# Patient Record
Sex: Male | Born: 1975 | Race: White | Hispanic: No | Marital: Single | Smoking: Current every day smoker
Health system: Southern US, Community
[De-identification: ages and names within clinical notes are randomized; demographics above are authoritative.]

## PROBLEM LIST (undated history)

## (undated) HISTORY — PX: SPLENECTOMY: SUR1306

---

## 2018-10-24 ENCOUNTER — Emergency Department (HOSPITAL_COMMUNITY): Payer: Self-pay

## 2018-10-24 ENCOUNTER — Encounter (HOSPITAL_COMMUNITY): Payer: Self-pay

## 2018-10-24 ENCOUNTER — Other Ambulatory Visit: Payer: Self-pay

## 2018-10-24 ENCOUNTER — Encounter (HOSPITAL_COMMUNITY): Payer: Self-pay | Admitting: Emergency Medicine

## 2018-10-24 ENCOUNTER — Emergency Department (HOSPITAL_COMMUNITY)
Admission: EM | Admit: 2018-10-24 | Discharge: 2018-10-24 | Disposition: A | Discharge status: Home / Self Care | Payer: Self-pay | Attending: Emergency Medicine | Admitting: Emergency Medicine

## 2018-10-24 ENCOUNTER — Emergency Department (HOSPITAL_COMMUNITY)
Admission: EM | Admit: 2018-10-24 | Discharge: 2018-10-25 | Discharge status: Against Medical Advice | Payer: Self-pay | Attending: Emergency Medicine | Admitting: Emergency Medicine

## 2018-10-24 DIAGNOSIS — L723 Sebaceous cyst: Secondary | ICD-10-CM | POA: Insufficient documentation

## 2018-10-24 DIAGNOSIS — Y999 Unspecified external cause status: Secondary | ICD-10-CM | POA: Insufficient documentation

## 2018-10-24 DIAGNOSIS — Y929 Unspecified place or not applicable: Secondary | ICD-10-CM | POA: Insufficient documentation

## 2018-10-24 DIAGNOSIS — L729 Follicular cyst of the skin and subcutaneous tissue, unspecified: Secondary | ICD-10-CM

## 2018-10-24 DIAGNOSIS — T07XXXA Unspecified multiple injuries, initial encounter: Secondary | ICD-10-CM | POA: Insufficient documentation

## 2018-10-24 DIAGNOSIS — L089 Local infection of the skin and subcutaneous tissue, unspecified: Secondary | ICD-10-CM

## 2018-10-24 DIAGNOSIS — Z041 Encounter for examination and observation following transport accident: Secondary | ICD-10-CM | POA: Insufficient documentation

## 2018-10-24 DIAGNOSIS — X58XXXA Exposure to other specified factors, initial encounter: Secondary | ICD-10-CM | POA: Insufficient documentation

## 2018-10-24 DIAGNOSIS — Y939 Activity, unspecified: Secondary | ICD-10-CM | POA: Insufficient documentation

## 2018-10-24 MED ORDER — LIDOCAINE HCL (PF) 1 % IJ SOLN
10.0000 mL | Freq: Once | INTRAMUSCULAR | Status: AC
  Administered 2018-10-24: 10 mL
  Filled 2018-10-24: qty 30

## 2018-10-24 MED ORDER — DOXYCYCLINE HYCLATE 100 MG PO CAPS: 100.0000 mg | ORAL_CAPSULE | Freq: Two times a day (BID) | ORAL | 0 refills | Status: DC

## 2018-10-24 NOTE — ED Provider Notes (Signed)
[facility]-EMERGENCY DEPT Provider Note   CSN: [id number] Arrival date & time: 10/24/18  1557     History   Chief Complaint Chief Complaint  Patient presents with  . Abscess    HPI Aaron Berger is a 42 y.o. male presenting for evaluation of facial lesion.   Patient states for the past 2 years, he has had a bump on his right chin.  Occasionally, he pops it and it drains fluid.  It has never been painful until today.  Today the pain is increased, and he has not been able to drain anything from it.  He denies fevers, chills, nausea, vomiting.  He has no medical problems, takes no medications daily.  He has not taken anything for it including Tylenol or ibuprofen.  When it drains, he states it drains pus then blood.  He has not seen anybody about this in the past.  Today, pain is been constant, worse with palpation.  Last tetanus shot was last year  HPI  History reviewed. No pertinent past medical history.  There are no active problems to display for this patient.   History reviewed. No pertinent surgical history.      Home Medications    Prior to Admission medications   Medication Sig Start Date End Date Taking? Authorizing Provider  doxycycline (VIBRAMYCIN) 100 MG capsule Take 1 capsule (100 mg total) by mouth 2 (two) times daily. 10/24/18   Dariona Postma, PA-C    Family History History reviewed. No pertinent family history.  Social History Social History   Tobacco Use  . Smoking status: Current Every Day Smoker  . Smokeless tobacco: Never Used  Substance Use Topics  . Alcohol use: Yes  . Drug use: Never     Allergies   Patient has no known allergies.   Review of Systems Review of Systems  Constitutional: Negative for fever.  HENT:       Lesion of r chin     Physical Exam Updated Vital Signs BP (!) 146/96 (BP Location: Left Arm)   Pulse 79   Temp 97.7 F (36.5 C) (Oral)   Resp 20   Ht 5\' 9"  (1.753 m)   Wt 77.1 kg    SpO2 100%   BMI 25.10 kg/m   Physical Exam  Constitutional: He is oriented to person, place, and time. He appears well-developed and well-nourished. No distress.  Sitting comfortably in the bed in no acute distress  HENT:  Head: Normocephalic and atraumatic.    Small lesion on the right chin.  No active drainage.  No overlying erythema.  Fluctuant.  No streaking.  No swelling under the chin or elsewhere around the face  Eyes: Pupils are equal, round, and reactive to light. Conjunctivae and EOM are normal.  Neck: Normal range of motion. Neck supple.  Cardiovascular: Normal rate, regular rhythm and intact distal pulses.  Pulmonary/Chest: Effort normal and breath sounds normal. No respiratory distress. He has no wheezes.  Abdominal: Soft. He exhibits no distension. There is no tenderness.  Musculoskeletal: Normal range of motion.  Neurological: He is alert and oriented to person, place, and time.  Skin: Skin is warm and dry. Capillary refill takes less than 2 seconds.  Psychiatric: He has a normal mood and affect.  Nursing note and vitals reviewed.    ED Treatments / Results  Labs (all labs ordered are listed, but only abnormal results are displayed) Labs Reviewed - No data to display  EKG None  Radiology No results  found.  Procedures .Marland KitchenIncision and Drainage Date/Time: 10/24/2018 6:08 PM Performed by: Alveria Apley, PA-C Authorized by: Alveria Apley, PA-C   Consent:    Consent obtained:  Verbal   Consent given by:  Patient   Risks discussed:  Bleeding, incomplete drainage, pain, infection and damage to other organs Location:    Indications for incision and drainage: Infected cyst.   Location:  Head   Head location:  Face Pre-procedure details:    Skin preparation:  Chloraprep Anesthesia (see MAR for exact dosages):    Anesthesia method:  Local infiltration   Local anesthetic:  Lidocaine 1% w/o epi Procedure type:    Complexity:  Simple Procedure details:      Needle aspiration: no     Incision types:  Single straight   Incision depth:  Dermal   Scalpel blade:  11   Wound management:  Probed and deloculated and irrigated with saline   Drainage:  Purulent, serosanguinous and bloody   Drainage amount:  Moderate   Wound treatment:  Wound left open   Packing materials:  None Post-procedure details:    Patient tolerance of procedure:  Tolerated well, no immediate complications   (including critical care time)  Medications Ordered in ED Medications  lidocaine (PF) (XYLOCAINE) 1 % injection 10 mL (10 mLs Infiltration Given by Other 10/24/18 1705)     Initial Impression / Assessment and Plan / ED Course  I have reviewed the triage vital signs and the nursing notes.  Pertinent labs & imaging results that were available during my care of the patient were reviewed by me and considered in my medical decision making (see chart for details).     Patient presenting for evaluation of lesion on the face which is been present for 2 years.  Today, lesion is more painful.  Physical exam reassuring, no signs of systemic infection.  He appears nontoxic.  No fevers, chills, tachycardia.  On exam, small, soft, and fluctuant lesion of the right chin.  Discussed risks including scarring, and likelihood that this is a cyst.  Patient requesting I&D, understands risks of scarring and infection.  I&D performed as described above.  Serosanguineous and purulent material expressed.  Discussed wound care.  Discussed follow-up with plastic surgery for removal of the cyst sac and/or ENT.  At this time, patient received a discharge.  Return precautions given.  Patient states he understands and agrees to plan.   Final Clinical Impressions(s) / ED Diagnoses   Final diagnoses:  Infected cyst of skin    ED Discharge Orders         Ordered    doxycycline (VIBRAMYCIN) 100 MG capsule  2 times daily     10/24/18 1656           Alveria Apley, PA-C 10/24/18  1810    Long, Arlyss Repress, MD 10/25/18 1549

## 2018-10-24 NOTE — ED Triage Notes (Signed)
Reports abscess to right side of his face x 2 years.  Reports he does drain this at home on occasion but the pain is increasing.

## 2018-10-24 NOTE — Discharge Instructions (Signed)
Take antibiotics as prescribed.  Take the entire course, even if your symptoms improve. Wash daily with soap and water, and reapply new dressing until it is no longer draining. Follow-up with Dr. Ulice Boldillingham if the cyst returns or becomes uncomfortable.  If her practice does not take out facials cysts, you may follow-up with Dr. Ezzard StandingNewman. Return to the emergency room with any new, worsening, concerning symptoms.
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
## 2018-10-25 ENCOUNTER — Encounter (HOSPITAL_COMMUNITY): Payer: Self-pay | Admitting: Emergency Medicine

## 2018-10-25 LAB — CBC WITH DIFFERENTIAL/PLATELET
Band Neutrophils: 0 %
Basophils Absolute: 0.2 10*3/uL — ABNORMAL HIGH (ref 0.0–0.1)
Basophils Relative: 1 %
Blasts: 0 %
EOS PCT: 3 %
Eosinophils Absolute: 0.5 10*3/uL (ref 0.0–0.5)
HCT: 45.3 % (ref 39.0–52.0)
Hemoglobin: 14.2 g/dL (ref 13.0–17.0)
LYMPHS ABS: 10.2 10*3/uL — AB (ref 0.7–4.0)
Lymphocytes Relative: 56 %
MCH: 30 pg (ref 26.0–34.0)
MCHC: 31.3 g/dL (ref 30.0–36.0)
MCV: 95.6 fL (ref 80.0–100.0)
Metamyelocytes Relative: 0 %
Monocytes Absolute: 1.5 10*3/uL — ABNORMAL HIGH (ref 0.1–1.0)
Monocytes Relative: 8 %
Myelocytes: 0 %
Neutro Abs: 5.8 10*3/uL (ref 1.7–7.7)
Neutrophils Relative %: 32 %
Other: 0 %
Platelets: 511 10*3/uL — ABNORMAL HIGH (ref 150–400)
Promyelocytes Relative: 0 %
RBC: 4.74 MIL/uL (ref 4.22–5.81)
RDW: 13.5 % (ref 11.5–15.5)
WBC: 18.2 10*3/uL — ABNORMAL HIGH (ref 4.0–10.5)
nRBC: 0 % (ref 0.0–0.2)
nRBC: 0 /100 WBC

## 2018-10-25 LAB — COMPREHENSIVE METABOLIC PANEL
ALT: 27 U/L (ref 0–44)
AST: 26 U/L (ref 15–41)
Albumin: 4.1 g/dL (ref 3.5–5.0)
Alkaline Phosphatase: 99 U/L (ref 38–126)
Anion gap: 15 (ref 5–15)
BUN: 9 mg/dL (ref 6–20)
CO2: 20 mmol/L — AB (ref 22–32)
Calcium: 8.9 mg/dL (ref 8.9–10.3)
Chloride: 99 mmol/L (ref 98–111)
Creatinine, Ser: 0.75 mg/dL (ref 0.61–1.24)
GFR calc Af Amer: >60 mL/min (ref >60)
GFR calc non Af Amer: >60 mL/min (ref >60)
Glucose, Bld: 98 mg/dL (ref 70–99)
Potassium: 3.7 mmol/L (ref 3.5–5.1)
Sodium: 134 mmol/L — ABNORMAL LOW (ref 135–145)
Total Bilirubin: 0.3 mg/dL (ref 0.3–1.2)
Total Protein: 7.4 g/dL (ref 6.5–8.1)

## 2018-10-25 LAB — PATHOLOGIST SMEAR REVIEW
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
## 2019-03-30 ENCOUNTER — Emergency Department (HOSPITAL_BASED_OUTPATIENT_CLINIC_OR_DEPARTMENT_OTHER)
Admission: EM | Admit: 2019-03-30 | Discharge: 2019-03-30 | Disposition: A | Discharge status: Home / Self Care | Payer: Self-pay | Attending: Emergency Medicine | Admitting: Emergency Medicine

## 2019-03-30 ENCOUNTER — Encounter (HOSPITAL_BASED_OUTPATIENT_CLINIC_OR_DEPARTMENT_OTHER): Payer: Self-pay | Admitting: *Deleted

## 2019-03-30 ENCOUNTER — Other Ambulatory Visit: Payer: Self-pay

## 2019-03-30 DIAGNOSIS — W57XXXA Bitten or stung by nonvenomous insect and other nonvenomous arthropods, initial encounter: Secondary | ICD-10-CM | POA: Insufficient documentation

## 2019-03-30 DIAGNOSIS — Y999 Unspecified external cause status: Secondary | ICD-10-CM | POA: Insufficient documentation

## 2019-03-30 DIAGNOSIS — F17210 Nicotine dependence, cigarettes, uncomplicated: Secondary | ICD-10-CM | POA: Insufficient documentation

## 2019-03-30 DIAGNOSIS — Y9389 Activity, other specified: Secondary | ICD-10-CM | POA: Insufficient documentation

## 2019-03-30 DIAGNOSIS — S00261A Insect bite (nonvenomous) of right eyelid and periocular area, initial encounter: Secondary | ICD-10-CM | POA: Insufficient documentation

## 2019-03-30 DIAGNOSIS — Y929 Unspecified place or not applicable: Secondary | ICD-10-CM | POA: Insufficient documentation

## 2019-03-30 MED ORDER — PREDNISONE 20 MG PO TABS: ORAL_TABLET | ORAL | 0 refills | Status: DC

## 2019-03-30 NOTE — ED Provider Notes (Signed)
[facility] EMERGENCY DEPARTMENT Provider Note   CSN: [id number] Arrival date & time: 03/30/19  0820    History   Chief Complaint Chief Complaint  Patient presents with  . Insect Bite    HPI Aaron Berger is a 43 y.o. male.     Patient is a 43 year old male who presents with some swelling over his right eye.  He states that he thinks he got bitten by something.  It happened yesterday evening.  It got worse this morning when he woke up.  He states he noticed a little bump where he thinks that something bit him while he was sleeping.  It is intensely itchy.  He has some swelling to his upper eyelid.  He denies any vision changes.  He denies any pain.  No drainage.  No fevers.  He has not use any medications for the symptoms.     History reviewed. No pertinent past medical history.  There are no active problems to display for this patient.   Past Surgical History:  Procedure Laterality Date  . SPLENECTOMY          Home Medications    Prior to Admission medications   Medication Sig Start Date End Date Taking? Authorizing Provider  doxycycline (VIBRAMYCIN) 100 MG capsule Take 1 capsule (100 mg total) by mouth 2 (two) times daily. 10/24/18   Caccavale, Sophia, PA-C  predniSONE (DELTASONE) 20 MG tablet 3 tabs po day one, then 2 po daily x 4 days 03/30/19   Rolan BuccoBelfi, Deryck Hippler, MD    Family History Family History  Problem Relation Age of Onset  . Cirrhosis Mother     Social History Social History   Tobacco Use  . Smoking status: Current Every Day Smoker    Packs/day: 1.00    Types: Cigarettes  . Smokeless tobacco: Never Used  Substance Use Topics  . Alcohol use: Yes    Comment: everyday - 1-2 cans of beer  . Drug use: Never     Allergies   Patient has no known allergies.   Review of Systems Review of Systems  Constitutional: Negative for fever.  HENT: Negative for congestion.   Eyes: Negative for photophobia, pain, discharge, redness, itching  and visual disturbance.  Gastrointestinal: Negative for vomiting.  Musculoskeletal: Negative for arthralgias.  Skin: Positive for rash.  Neurological: Negative for dizziness and headaches.     Physical Exam Updated Vital Signs BP (!) 132/98 (BP Location: Right Arm)   Pulse 81   Temp 97.9 F (36.6 C) (Oral)   Resp 18   Ht 5\' 10"  (1.778 m)   Wt 79.4 kg   SpO2 99%   BMI 25.11 kg/m   Physical Exam Constitutional:      Appearance: Normal appearance.  HENT:     Head: Normocephalic.     Comments: Patient has a small raised area over his right eyebrow.  There is some localized swelling to the area and swelling to the right upper eyelid.  It is nontender.  There is no induration.  No fluctuance.  No drainage. Eyes:     Extraocular Movements: Extraocular movements intact.     Conjunctiva/sclera: Conjunctivae normal.     Pupils: Pupils are equal, round, and reactive to light.     Comments: No conjunctival injection.  No photophobia  Cardiovascular:     Rate and Rhythm: Normal rate.  Pulmonary:     Effort: Pulmonary effort is normal.  Musculoskeletal: Normal range of motion.  Skin:    General:  Skin is warm and dry.  Neurological:     Mental Status: He is alert.        ED Treatments / Results  Labs (all labs ordered are listed, but only abnormal results are displayed) Labs Reviewed - No data to display  EKG None  Radiology No results found.  Procedures Procedures (including critical care time)  Medications Ordered in ED Medications - No data to display   Initial Impression / Assessment and Plan / ED Course  I have reviewed the triage vital signs and the nursing notes.  Pertinent labs & imaging results that were available during my care of the patient were reviewed by me and considered in my medical decision making (see chart for details).        Patient presents with what appears to be an insect bite over his right eye.  There is no evidence of abscess.   It is intensely itchy and not painful.  It looks most consistent with an insect bite with localized reaction versus infection.  There is no warmth.  There is mild erythema.  I will treat him with a 5-day course of prednisone.  He was also advised to use Benadryl as needed for the itching.  Return precautions were given.  Final Clinical Impressions(s) / ED Diagnoses   Final diagnoses:  Insect bite of right periocular area, initial encounter    ED Discharge Orders         Ordered    predniSONE (DELTASONE) 20 MG tablet     03/30/19 0845           Rolan Bucco, MD 03/30/19 [phone]

## 2019-03-30 NOTE — ED Notes (Signed)
ED Provider at bedside. 

## 2019-03-30 NOTE — ED Triage Notes (Signed)
Woke up yesterday morning around 2 am and noted that his right eye is swollen and he went back to sleep.  This morning, he noticed that it is more swollen today than yesterday and it itches.

## 2019-03-30 NOTE — ED Notes (Signed)
Pt verbalized understanding of dc instructions.

## 2019-11-25 ENCOUNTER — Other Ambulatory Visit: Payer: Self-pay

## 2019-11-25 ENCOUNTER — Encounter (HOSPITAL_BASED_OUTPATIENT_CLINIC_OR_DEPARTMENT_OTHER): Payer: Self-pay

## 2019-11-25 ENCOUNTER — Emergency Department (HOSPITAL_BASED_OUTPATIENT_CLINIC_OR_DEPARTMENT_OTHER)
Admission: EM | Admit: 2019-11-25 | Discharge: 2019-11-25 | Disposition: A | Discharge status: Home / Self Care | Payer: Self-pay | Attending: Emergency Medicine | Admitting: Emergency Medicine

## 2019-11-25 DIAGNOSIS — M25552 Pain in left hip: Secondary | ICD-10-CM | POA: Insufficient documentation

## 2019-11-25 DIAGNOSIS — F17210 Nicotine dependence, cigarettes, uncomplicated: Secondary | ICD-10-CM | POA: Insufficient documentation

## 2019-11-25 MED ORDER — CYCLOBENZAPRINE HCL 10 MG PO TABS: 10.0000 mg | ORAL_TABLET | Freq: Two times a day (BID) | ORAL | 0 refills | Status: DC | PRN

## 2019-11-25 MED ORDER — PREDNISONE 20 MG PO TABS: 40.0000 mg | ORAL_TABLET | Freq: Every day | ORAL | 0 refills | Status: DC

## 2019-11-25 NOTE — Discharge Instructions (Addendum)
Take Prednisone for the next 5 days  Take Flexeril as needed for muscle pain or spasms. Do not drink alcohol or drive while taking this medicine Use ice for 20 minutes at a time over the hip area Follow up with Orthopedics if symptoms are not improving

## 2019-11-25 NOTE — ED Provider Notes (Signed)
[facility] EMERGENCY DEPARTMENT Provider Note   CSN: [id number] Arrival date & time: 11/25/19  1603     History Chief Complaint  Patient presents with  . Hip Pain    Aaron Berger is a 44 y.o. male who presents with left hip pain.  Patient states that the pain started on Thursday while he was at work.  He works in [employer].  He denies a specific injury.  Over the next several days the pain worsened he states that Saturday is the worst and he could get out of bed.  His girlfriend gave him a leftover Flexeril he states this improved his pain.  Over the next couple of days he has had waxing and waning pain.  It is primarily over the lateral left hip.  It is worse when he touches the area and lying on the left side.  He is also had pain radiating from his hip to his foot at times.  He states he is not having any pain right now.  He denies low back pain or knee pain. He denies history of similar pain.  HPI     History reviewed. No pertinent past medical history.  There are no problems to display for this patient.   Past Surgical History:  Procedure Laterality Date  . SPLENECTOMY         Family History  Problem Relation Age of Onset  . Cirrhosis Mother     Social History   Tobacco Use  . Smoking status: Current Every Day Smoker    Packs/day: 1.00    Types: Cigarettes  . Smokeless tobacco: Never Used  Substance Use Topics  . Alcohol use: Yes    Comment: daily  . Drug use: Never    Home Medications Prior to Admission medications   Medication Sig Start Date End Date Taking? Authorizing Provider  doxycycline (VIBRAMYCIN) 100 MG capsule Take 1 capsule (100 mg total) by mouth 2 (two) times daily. 10/24/18   Caccavale, Sophia, PA-C  predniSONE (DELTASONE) 20 MG tablet 3 tabs po day one, then 2 po daily x 4 days 03/30/19   Malvin Johns, MD    Allergies    Patient has no known allergies.  Review of Systems   Review of Systems  Musculoskeletal: Positive  for arthralgias.  Neurological: Negative for weakness and numbness.    Physical Exam Updated Vital Signs BP (!) 153/102 (BP Location: Right Arm)   Pulse 73   Temp 98.7 F (37.1 C) (Oral)   Resp 14   Ht 6' (1.829 m)   Wt 79.4 kg   SpO2 100%   BMI 23.73 kg/m   Physical Exam Vitals and nursing note reviewed.  Constitutional:      General: He is not in acute distress.    Appearance: Normal appearance. He is well-developed. He is not ill-appearing.  HENT:     Head: Normocephalic and atraumatic.  Eyes:     General: No scleral icterus.       Right eye: No discharge.        Left eye: No discharge.     Conjunctiva/sclera: Conjunctivae normal.     Pupils: Pupils are equal, round, and reactive to light.  Cardiovascular:     Rate and Rhythm: Normal rate.  Pulmonary:     Effort: Pulmonary effort is normal. No respiratory distress.  Abdominal:     General: There is no distension.  Musculoskeletal:     Cervical back: Normal range of motion.  Comments: Tenderness over the left buttocks and left lateral hip. No back, groin, thigh, knee tenderness.   Ambulatory with limp  Skin:    General: Skin is warm and dry.  Neurological:     Mental Status: He is alert and oriented to person, place, and time.  Psychiatric:        Behavior: Behavior normal.     ED Results / Procedures / Treatments   Labs (all labs ordered are listed, but only abnormal results are displayed) Labs Reviewed - No data to display  EKG None  Radiology No results found.  Procedures Procedures (including critical care time)  Medications Ordered in ED Medications - No data to display  ED Course  I have reviewed the triage vital signs and the nursing notes.  Pertinent labs & imaging results that were available during my care of the patient were reviewed by me and considered in my medical decision making (see chart for details).  44 year old male presents with left hip pain for the past 5 days.  He is  hypertensive but otherwise vitals are normal.  He is tender over the left buttock and left lateral hip.  Suspect trochanteric bursitis versus sciatica due to tight muscles such as piriformis syndrome.  Do not feel imaging is necessary today.  We will treat with steroid burst and muscle relaxers.  Advised follow-up with orthopedics if symptoms are not improving.  Work note given.   MDM Rules/Calculators/A&P                       Final Clinical Impression(s) / ED Diagnoses Final diagnoses:  Left hip pain    Rx / DC Orders ED Discharge Orders    None       Bethel Born, PA-C 11/25/19 1933    Alvira Monday, MD 11/27/19 1429

## 2019-11-25 NOTE — ED Triage Notes (Signed)
Pt c/o pain to left hip area that radiates down left LE-pain started 11/19/2018-denies injury-states he needs a work note-NAD-limping gait

## 2019-12-03 ENCOUNTER — Encounter (HOSPITAL_COMMUNITY): Payer: Self-pay

## 2019-12-03 ENCOUNTER — Emergency Department (HOSPITAL_COMMUNITY)
Admission: EM | Admit: 2019-12-03 | Discharge: 2019-12-03 | Disposition: A | Discharge status: Home / Self Care | Payer: Self-pay | Attending: Emergency Medicine | Admitting: Emergency Medicine

## 2019-12-03 ENCOUNTER — Emergency Department (HOSPITAL_COMMUNITY): Payer: Self-pay

## 2019-12-03 DIAGNOSIS — Y999 Unspecified external cause status: Secondary | ICD-10-CM | POA: Insufficient documentation

## 2019-12-03 DIAGNOSIS — Y92410 Unspecified street and highway as the place of occurrence of the external cause: Secondary | ICD-10-CM | POA: Insufficient documentation

## 2019-12-03 DIAGNOSIS — Y93I9 Activity, other involving external motion: Secondary | ICD-10-CM | POA: Insufficient documentation

## 2019-12-03 DIAGNOSIS — F17210 Nicotine dependence, cigarettes, uncomplicated: Secondary | ICD-10-CM | POA: Insufficient documentation

## 2019-12-03 DIAGNOSIS — S51011A Laceration without foreign body of right elbow, initial encounter: Secondary | ICD-10-CM | POA: Insufficient documentation

## 2019-12-03 MED ORDER — CEPHALEXIN 500 MG PO CAPS
500.0000 mg | ORAL_CAPSULE | Freq: Once | ORAL | Status: AC
  Administered 2019-12-03: 500 mg via ORAL
  Filled 2019-12-03: qty 1

## 2019-12-03 MED ORDER — CEPHALEXIN 500 MG PO CAPS: 500.0000 mg | ORAL_CAPSULE | Freq: Two times a day (BID) | ORAL | 0 refills | Status: AC
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
## 2019-12-03 NOTE — ED Notes (Signed)
Pt has an inch laceration to his right elbow, it appears to be a deep puncture wound, bleeding is not controlled
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
## 2019-12-22 ENCOUNTER — Emergency Department (HOSPITAL_BASED_OUTPATIENT_CLINIC_OR_DEPARTMENT_OTHER): Payer: Self-pay

## 2019-12-22 ENCOUNTER — Encounter (HOSPITAL_BASED_OUTPATIENT_CLINIC_OR_DEPARTMENT_OTHER): Payer: Self-pay | Admitting: *Deleted

## 2019-12-22 ENCOUNTER — Emergency Department (HOSPITAL_BASED_OUTPATIENT_CLINIC_OR_DEPARTMENT_OTHER)
Admission: EM | Admit: 2019-12-22 | Discharge: 2019-12-22 | Disposition: A | Discharge status: Home / Self Care | Payer: Self-pay | Attending: Emergency Medicine | Admitting: Emergency Medicine

## 2019-12-22 ENCOUNTER — Other Ambulatory Visit: Payer: Self-pay

## 2019-12-22 DIAGNOSIS — F17210 Nicotine dependence, cigarettes, uncomplicated: Secondary | ICD-10-CM | POA: Insufficient documentation

## 2019-12-22 DIAGNOSIS — Y929 Unspecified place or not applicable: Secondary | ICD-10-CM | POA: Insufficient documentation

## 2019-12-22 DIAGNOSIS — Y939 Activity, unspecified: Secondary | ICD-10-CM | POA: Insufficient documentation

## 2019-12-22 DIAGNOSIS — S9031XA Contusion of right foot, initial encounter: Secondary | ICD-10-CM | POA: Insufficient documentation

## 2019-12-22 DIAGNOSIS — W208XXA Other cause of strike by thrown, projected or falling object, initial encounter: Secondary | ICD-10-CM | POA: Insufficient documentation

## 2019-12-22 DIAGNOSIS — Y999 Unspecified external cause status: Secondary | ICD-10-CM | POA: Insufficient documentation

## 2019-12-22 NOTE — Discharge Instructions (Signed)
Please read instructions below. Apply ice to your foot for 20 minutes at a time. You can take 600mg  of ibuprofen every 6 hours as needed for pain. Being weight-bearing as tolerated. Schedule an appointment with your primary care if symptoms persist. Return to the ER for new or concerning symptoms.

## 2019-12-22 NOTE — ED Notes (Signed)
1500- UDS obtained as per employers instructions.  Ace wrap applied and crutches set up with instructions of use per Jonny Ruiz EMT

## 2019-12-22 NOTE — ED Notes (Signed)
ED Provider at bedside. 

## 2019-12-22 NOTE — ED Provider Notes (Signed)
[facility] EMERGENCY DEPARTMENT Provider Note   CSN: [id number] Arrival date & time: 12/22/19  1308     History Chief Complaint  Patient presents with  . Foot Injury    Aaron Berger is a 44 y.o. male presenting with sudden onset of right foot pain that began prior to arrival.  Patient states a 6 x 6 wooden pole fell onto the top of his foot.  He is significant pain to the dorsum of his foot that is worse with movement.  His ankle is not bothering him.  He has pain with range of motion and weightbearing.  No interventions tried prior to arrival.  The history is provided by the patient.       History reviewed. No pertinent past medical history.  There are no problems to display for this patient.   Past Surgical History:  Procedure Laterality Date  . SPLENECTOMY         Family History  Problem Relation Age of Onset  . Cirrhosis Mother     Social History   Tobacco Use  . Smoking status: Current Every Day Smoker    Packs/day: 1.00    Types: Cigarettes  . Smokeless tobacco: Never Used  Substance Use Topics  . Alcohol use: Yes    Comment: daily  . Drug use: Never    Home Medications Prior to Admission medications   Medication Sig Start Date End Date Taking? Authorizing Provider  cyclobenzaprine (FLEXERIL) 10 MG tablet Take 1 tablet (10 mg total) by mouth 2 (two) times daily as needed for muscle spasms. 11/25/19   Bethel Born, PA-C  predniSONE (DELTASONE) 20 MG tablet Take 2 tablets (40 mg total) by mouth daily. 11/25/19   Bethel Born, PA-C    Allergies    Patient has no known allergies.  Review of Systems   Review of Systems  All other systems reviewed and are negative.   Physical Exam Updated Vital Signs BP (!) 131/96 (BP Location: Right Arm)   Pulse 70   Temp 98.1 F (36.7 C)   Resp 15   Ht 6' (1.829 m)   Wt 79.4 kg   SpO2 98%   BMI 23.73 kg/m   Physical Exam Vitals and nursing note reviewed.  Constitutional:    Appearance: He is well-developed.  HENT:     Head: Normocephalic and atraumatic.  Eyes:     Conjunctiva/sclera: Conjunctivae normal.  Cardiovascular:     Rate and Rhythm: Normal rate.  Pulmonary:     Effort: Pulmonary effort is normal.  Musculoskeletal:     Comments: Right dorsal foot with some light bruising present over the first and second metatarsals.  There is associated tenderness here and slight swelling.  No deformity.  No wounds.  Normal range of motion of the ankle without pain.  Neurological:     Mental Status: He is alert.  Psychiatric:        Mood and Affect: Mood normal.        Behavior: Behavior normal.     ED Results / Procedures / Treatments   Labs (all labs ordered are listed, but only abnormal results are displayed) Labs Reviewed - No data to display  EKG None  Radiology DG Foot Complete Right  Result Date: 12/22/2019 CLINICAL DATA:  Injury.  Right foot pain EXAM: RIGHT FOOT COMPLETE - 3+ VIEW COMPARISON:  None. FINDINGS: There is no evidence of fracture or dislocation. There is no evidence of arthropathy or other focal bone abnormality.  Soft tissues are unremarkable. IMPRESSION: Negative. Electronically Signed   By: Franchot Gallo M.D.   On: 12/22/2019 13:48    Procedures Procedures (including critical care time)  Medications Ordered in ED Medications - No data to display  ED Course  I have reviewed the triage vital signs and the nursing notes.  Pertinent labs & imaging results that were available during my care of the patient were reviewed by me and considered in my medical decision making (see chart for details).    MDM Rules/Calculators/A&P                      Patient with right foot contusion after a 6 x 6 post of wood fell onto the top of his foot today.  No wounds.  X-rays negative for acute fracture.  Exam consistent with contusion.  Will treat symptomatically with RICE therapy including Ace wrap.  OTC medications for pain. Will provide  crutches to use for the next couple of days as he is having a bit of pain with weightbearing.  recommend he begin weightbearing as tolerated.  PCP follow-up if symptoms persist.  Patient is agreeable to plan and safe for discharge.  Discussed results, findings, treatment and follow up. Patient advised of return precautions. Patient verbalized understanding and agreed with plan.  Final Clinical Impression(s) / ED Diagnoses Final diagnoses:  Contusion of right foot, initial encounter    Rx / DC Orders ED Discharge Orders    None       , [place] N, PA-C 12/22/19 1412    Sherwood Gambler, MD 12/24/19 [phone]
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
## 2020-04-25 ENCOUNTER — Emergency Department (HOSPITAL_BASED_OUTPATIENT_CLINIC_OR_DEPARTMENT_OTHER)
Admission: EM | Admit: 2020-04-25 | Discharge: 2020-04-25 | Disposition: A | Discharge status: Home / Self Care | Payer: Self-pay | Attending: Emergency Medicine | Admitting: Emergency Medicine

## 2020-04-25 ENCOUNTER — Encounter (HOSPITAL_BASED_OUTPATIENT_CLINIC_OR_DEPARTMENT_OTHER): Payer: Self-pay | Admitting: *Deleted

## 2020-04-25 ENCOUNTER — Other Ambulatory Visit: Payer: Self-pay

## 2020-04-25 DIAGNOSIS — F17210 Nicotine dependence, cigarettes, uncomplicated: Secondary | ICD-10-CM | POA: Insufficient documentation

## 2020-04-25 DIAGNOSIS — X500XXA Overexertion from strenuous movement or load, initial encounter: Secondary | ICD-10-CM | POA: Insufficient documentation

## 2020-04-25 DIAGNOSIS — Z79899 Other long term (current) drug therapy: Secondary | ICD-10-CM | POA: Insufficient documentation

## 2020-04-25 DIAGNOSIS — M25511 Pain in right shoulder: Secondary | ICD-10-CM | POA: Insufficient documentation

## 2020-04-25 NOTE — ED Triage Notes (Signed)
Rt shoulder pain, injury to rt shoulder this past Thursday, working on a garage door and spring kicked back

## 2020-04-25 NOTE — ED Provider Notes (Signed)
[facility] EMERGENCY DEPARTMENT Provider Note   CSN: [id number] Arrival date & time: 04/25/20  1035     History Chief Complaint  Patient presents with  . Rt Shoulder Pain    Aaron Berger is a 44 y.o. male presenting with right shoulder pain that began Friday. 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 He was pulling back a spring when he heard a pop in his right shoulder. He had pain on the top of his shoulder that was worse with any movement, however today he feels as though his symptoms have nearly resolved. Movement still causes some pain though he describes that feeling as like stretching a sore muscle, and feels somewhat good to do. He presents for a note to return to work.   The history is provided by the patient.       History reviewed. No pertinent past medical history.  There are no problems to display for this patient.   Past Surgical History:  Procedure Laterality Date  . SPLENECTOMY         Family History  Problem Relation Age of Onset  . Cirrhosis Mother     Social History   Tobacco Use  . Smoking status: Current Every Day Smoker    Packs/day: 1.00    Types: Cigarettes  . Smokeless tobacco: Never Used  Substance Use Topics  . Alcohol use: Yes    Comment: daily  . Drug use: Never    Home Medications Prior to Admission medications   Medication Sig Start Date End Date Taking? Authorizing Provider  cyclobenzaprine (FLEXERIL) 10 MG tablet Take 1 tablet (10 mg total) by mouth 2 (two) times daily as needed for muscle spasms. 11/25/19   Aaron Born, PA-C  predniSONE (DELTASONE) 20 MG tablet Take 2 tablets (40 mg total) by mouth daily. 11/25/19   Aaron Born, PA-C    Allergies    Patient has no known allergies.  Review of Systems   Review of Systems  Musculoskeletal: Positive for arthralgias.  Neurological: Negative for numbness.    Physical Exam Updated Vital Signs BP (!) 125/97 (BP Location: Right  Arm)   Pulse 100   Temp 98.4 F (36.9 C) (Oral)   Resp 20   Ht 5\' 10"  (1.778 m)   Wt 77.1 kg   SpO2 98%   BMI 24.39 kg/m   Physical Exam Vitals and nursing note reviewed.  Constitutional:      General: He is not in acute distress.    Appearance: He is well-developed.  HENT:     Head: Normocephalic and atraumatic.  Eyes:     Conjunctiva/sclera: Conjunctivae normal.  Cardiovascular:     Rate and Rhythm: Normal rate.  Pulmonary:     Effort: Pulmonary effort is normal.  Musculoskeletal:     Comments: Right shoulder without deformity or swelling. Mild TTP over the Aaron Berger joint/distal end of the acromion. Some pain with empty can test. Normal ROM. 5/5 grip strength BUE. Normal sensation and radial pulses.  Neurological:     Mental Status: He is alert.  Psychiatric:        Mood and Affect: Mood normal.        Behavior: Behavior normal.     ED Results / Procedures / Treatments   Labs (all labs ordered are listed, but only abnormal results are displayed) Labs Reviewed - No data to display  EKG None  Radiology No results found.  Procedures Procedures (including critical  care time)  Medications Ordered in ED Medications - No data to display  ED Course  I have reviewed the triage vital signs and the nursing notes.  Pertinent labs & imaging results that were available during my care of the patient were reviewed by me and considered in my medical decision making (see chart for details).    MDM Rules/Calculators/A&P                      Pt presenting with right shoulder pain that began on Friday while at work.  He states pain is nearly resolved, he is here for a note to return to work as he injured it while at work.  Patient has some pain with empty can test though otherwise has reassuring exam.  Return to normal activity as tolerated.  Recommend if symptoms return began having pain again, to follow-up outpatient.  Referral to sports medicine provided.  Symptomatic management  discussed.  Safe for discharge.  Final Clinical Impression(s) / ED Diagnoses Final diagnoses:  Acute pain of right shoulder    Rx / DC Orders ED Discharge Orders    None       Aaron Berger, Aaron N, PA-C 04/25/20 1106    Aaron Belling, MD 04/25/20 1505

## 2020-04-25 NOTE — Discharge Instructions (Signed)
Please read instructions below. Apply ice to your shoulder for 20 minutes at a time as needed. You can take ibuprofen every 6 hours as needed for pain. Schedule an appointment with the sports medicine physician for follow-up on your injury if symptoms persist. Return to the ER for new or concerning symptoms.

## 2020-11-05 ENCOUNTER — Inpatient Hospital Stay (HOSPITAL_BASED_OUTPATIENT_CLINIC_OR_DEPARTMENT_OTHER)
Admission: EM | Admit: 2020-11-05 | Discharge: 2020-11-07 | DRG: 482 | Disposition: A | Discharge status: Home / Self Care | Payer: Worker's Compensation | Attending: Family Medicine | Admitting: Family Medicine

## 2020-11-05 ENCOUNTER — Emergency Department (HOSPITAL_BASED_OUTPATIENT_CLINIC_OR_DEPARTMENT_OTHER): Payer: Worker's Compensation

## 2020-11-05 ENCOUNTER — Encounter (HOSPITAL_BASED_OUTPATIENT_CLINIC_OR_DEPARTMENT_OTHER): Payer: Self-pay

## 2020-11-05 ENCOUNTER — Other Ambulatory Visit: Payer: Self-pay

## 2020-11-05 DIAGNOSIS — Z20822 Contact with and (suspected) exposure to covid-19: Secondary | ICD-10-CM | POA: Diagnosis present

## 2020-11-05 DIAGNOSIS — S72002A Fracture of unspecified part of neck of left femur, initial encounter for closed fracture: Secondary | ICD-10-CM | POA: Diagnosis present

## 2020-11-05 DIAGNOSIS — Z419 Encounter for procedure for purposes other than remedying health state, unspecified: Secondary | ICD-10-CM

## 2020-11-05 DIAGNOSIS — W1789XA Other fall from one level to another, initial encounter: Secondary | ICD-10-CM | POA: Diagnosis present

## 2020-11-05 DIAGNOSIS — Z7952 Long term (current) use of systemic steroids: Secondary | ICD-10-CM | POA: Diagnosis not present

## 2020-11-05 DIAGNOSIS — S7292XA Unspecified fracture of left femur, initial encounter for closed fracture: Secondary | ICD-10-CM

## 2020-11-05 DIAGNOSIS — E559 Vitamin D deficiency, unspecified: Secondary | ICD-10-CM | POA: Diagnosis present

## 2020-11-05 DIAGNOSIS — Z9081 Acquired absence of spleen: Secondary | ICD-10-CM

## 2020-11-05 DIAGNOSIS — F17210 Nicotine dependence, cigarettes, uncomplicated: Secondary | ICD-10-CM | POA: Diagnosis present

## 2020-11-05 DIAGNOSIS — D72829 Elevated white blood cell count, unspecified: Secondary | ICD-10-CM | POA: Diagnosis present

## 2020-11-05 DIAGNOSIS — S72142A Displaced intertrochanteric fracture of left femur, initial encounter for closed fracture: Principal | ICD-10-CM | POA: Diagnosis present

## 2020-11-05 LAB — CBC WITH DIFFERENTIAL/PLATELET
Abs Immature Granulocytes: 0.11 10*3/uL — ABNORMAL HIGH (ref 0.00–0.07)
Basophils Absolute: 0.1 10*3/uL (ref 0.0–0.1)
Basophils Relative: 0 %
Eosinophils Absolute: 0.1 10*3/uL (ref 0.0–0.5)
Eosinophils Relative: 0 %
HCT: 44.8 % (ref 39.0–52.0)
Hemoglobin: 15.4 g/dL (ref 13.0–17.0)
Immature Granulocytes: 1 %
Lymphocytes Relative: 15 %
Lymphs Abs: 3.5 10*3/uL (ref 0.7–4.0)
MCH: 32.6 pg (ref 26.0–34.0)
MCHC: 34.4 g/dL (ref 30.0–36.0)
MCV: 94.9 fL (ref 80.0–100.0)
Monocytes Absolute: 2 10*3/uL — ABNORMAL HIGH (ref 0.1–1.0)
Monocytes Relative: 9 %
Neutro Abs: 18.3 10*3/uL — ABNORMAL HIGH (ref 1.7–7.7)
Neutrophils Relative %: 75 %
Platelets: 511 10*3/uL — ABNORMAL HIGH (ref 150–400)
RBC: 4.72 MIL/uL (ref 4.22–5.81)
RDW: 13.2 % (ref 11.5–15.5)
WBC: 24.1 10*3/uL — ABNORMAL HIGH (ref 4.0–10.5)
nRBC: 0 % (ref 0.0–0.2)

## 2020-11-05 LAB — RESP PANEL BY RT-PCR (FLU A&B, COVID) ARPGX2
Influenza A by PCR: NEGATIVE
Influenza B by PCR: NEGATIVE
SARS Coronavirus 2 by RT PCR: NEGATIVE

## 2020-11-05 LAB — BASIC METABOLIC PANEL
Anion gap: 13 (ref 5–15)
BUN: 10 mg/dL (ref 6–20)
CO2: 23 mmol/L (ref 22–32)
Calcium: 9.4 mg/dL (ref 8.9–10.3)
Chloride: 101 mmol/L (ref 98–111)
Creatinine, Ser: 0.74 mg/dL (ref 0.61–1.24)
GFR, Estimated: >60 mL/min (ref >60)
Glucose, Bld: 103 mg/dL — ABNORMAL HIGH (ref 70–99)
Potassium: 3.8 mmol/L (ref 3.5–5.1)
Sodium: 137 mmol/L (ref 135–145)

## 2020-11-05 LAB — LACTIC ACID, PLASMA: Lactic Acid, Venous: 1.1 mmol/L (ref 0.5–1.9)

## 2020-11-05 LAB — URINALYSIS, ROUTINE W REFLEX MICROSCOPIC
Bilirubin Urine: NEGATIVE
Glucose, UA: NEGATIVE mg/dL
Hgb urine dipstick: NEGATIVE
Ketones, ur: NEGATIVE mg/dL
Leukocytes,Ua: NEGATIVE
Nitrite: NEGATIVE
Protein, ur: NEGATIVE mg/dL
Specific Gravity, Urine: 1.02 (ref 1.005–1.030)
pH: 6 (ref 5.0–8.0)

## 2020-11-05 LAB — ETHANOL: Alcohol, Ethyl (B): <10 mg/dL (ref <10)

## 2020-11-05 MED ORDER — IOHEXOL 300 MG/ML  SOLN
100.0000 mL | Freq: Once | INTRAMUSCULAR | Status: AC | PRN
  Administered 2020-11-05: 100 mL via INTRAVENOUS

## 2020-11-05 MED ORDER — HYDROMORPHONE HCL 1 MG/ML IJ SOLN: 0.5000 mg | INTRAMUSCULAR | Status: DC | PRN

## 2020-11-05 MED ORDER — HYDROMORPHONE HCL 1 MG/ML IJ SOLN
1.0000 mg | Freq: Once | INTRAMUSCULAR | Status: AC
  Administered 2020-11-05: 1 mg via INTRAVENOUS
  Filled 2020-11-05: qty 1

## 2020-11-05 MED ORDER — HYDROMORPHONE HCL 1 MG/ML IJ SOLN
1.0000 mg | Freq: Once | INTRAMUSCULAR | Status: AC
Start: 2020-11-05 — End: 2020-11-05
  Administered 2020-11-05: 1 mg via INTRAVENOUS
  Filled 2020-11-05: qty 1

## 2020-11-05 MED ORDER — HYDROMORPHONE HCL 1 MG/ML IJ SOLN
1.0000 mg | INTRAMUSCULAR | Status: DC | PRN
Start: 2020-11-05 — End: 2020-11-07
  Administered 2020-11-05 – 2020-11-06 (×4): 1 mg via INTRAVENOUS
  Filled 2020-11-05 (×5): qty 1

## 2020-11-05 MED ORDER — FENTANYL CITRATE (PF) 100 MCG/2ML IJ SOLN
INTRAMUSCULAR | Status: AC
  Filled 2020-11-05: qty 2

## 2020-11-05 MED ORDER — LACTATED RINGERS IV SOLN: INTRAVENOUS | Status: AC

## 2020-11-05 MED ORDER — FENTANYL CITRATE (PF) 100 MCG/2ML IJ SOLN
50.0000 ug | INTRAMUSCULAR | Status: DC | PRN
Start: 2020-11-05 — End: 2020-11-05

## 2020-11-05 MED ORDER — FENTANYL CITRATE (PF) 100 MCG/2ML IJ SOLN
50.0000 ug | Freq: Once | INTRAMUSCULAR | Status: AC
  Administered 2020-11-05: 50 ug via INTRAVENOUS

## 2020-11-05 NOTE — ED Notes (Signed)
Verbal order from ED provider Dr. Carlean Jews for Dilaudid 1mg .

## 2020-11-05 NOTE — ED Triage Notes (Signed)
Pt states was tying a rope on his work truck and fell off. Reports pain to L hip.

## 2020-11-05 NOTE — ED Notes (Signed)
Assumed care of pt from hallway bed.

## 2020-11-05 NOTE — ED Provider Notes (Addendum)
[facility] EMERGENCY DEPARTMENT Provider Note   CSN: [id number] Arrival date & time: 11/05/20  1559     History Chief Complaint  Patient presents with  . Fall    Aaron Berger is a 44 y.o. male otherwise healthy no daily medication use.  Patient arrives with left hip pain after fall that occurred just prior to arrival.  He was at work standing about 4 feet off the ground on a truck and pulling the door closed with a rope when he slipped backwards falling directly onto his left hip.  Immediate onset severe pain throbbing aching constant nonradiating worsened with movement and palpation minimally improved with rest, no medications prior to arrival.  Denies head injury, blood thinner use, loss of consciousness, headache, neck pain, back pain, chest pain, abdominal pain, numbness/tingling, weakness, pain of the upper extremities, pain of the right lower extremity or any additional concerns.  HPI     History reviewed. No pertinent past medical history.  Patient Active Problem List   Diagnosis Date Noted  . Leukocytosis 11/06/2020  . Nicotine dependence, cigarettes, uncomplicated 11/06/2020  . Closed left hip fracture, initial encounter (HCC) 11/05/2020    Past Surgical History:  Procedure Laterality Date  . SPLENECTOMY         Family History  Problem Relation Age of Onset  . Cirrhosis Mother     Social History   Tobacco Use  . Smoking status: Current Every Day Smoker    Packs/day: 1.00    Types: Cigarettes  . Smokeless tobacco: Never Used  Vaping Use  . Vaping Use: Never used  Substance Use Topics  . Alcohol use: Yes    Comment: daily  . Drug use: Never    Home Medications Prior to Admission medications   Medication Sig Start Date End Date Taking? Authorizing Provider  cyclobenzaprine (FLEXERIL) 10 MG tablet Take 1 tablet (10 mg total) by mouth 2 (two) times daily as needed for muscle spasms. Patient not taking: No sig reported 11/25/19   Bethel Born, PA-C  predniSONE (DELTASONE) 20 MG tablet Take 2 tablets (40 mg total) by mouth daily. Patient not taking: No sig reported 11/25/19   Bethel Born, PA-C    Allergies    Patient has no known allergies.  Review of Systems   Review of Systems Ten systems are reviewed and are negative for acute change except as noted in the HPI  Physical Exam Updated Vital Signs BP 129/89 (BP Location: Right Arm)   Pulse 72   Temp 98.5 F (36.9 C) (Oral)   Resp 14   Ht 6' (1.829 m)   Wt 79.4 kg   SpO2 96%   BMI 23.73 kg/m   Physical Exam Constitutional:      General: He is not in acute distress.    Appearance: Normal appearance. He is well-developed. He is not ill-appearing or diaphoretic.  HENT:     Head: Normocephalic and atraumatic. No abrasion or contusion.     Jaw: There is normal jaw occlusion. No trismus.     Right Ear: External ear normal.     Left Ear: External ear normal.     Nose: Nose normal.     Mouth/Throat:     Mouth: Mucous membranes are moist.     Pharynx: Oropharynx is clear.  Eyes:     General: Vision grossly intact. Gaze aligned appropriately.     Extraocular Movements: Extraocular movements intact.     Conjunctiva/sclera: Conjunctivae normal.  Pupils: Pupils are equal, round, and reactive to light.  Neck:     Trachea: Trachea and phonation normal. No tracheal tenderness or tracheal deviation.  Cardiovascular:     Rate and Rhythm: Normal rate and regular rhythm.     Pulses:          Dorsalis pedis pulses are 2+ on the right side and 2+ on the left side.  Pulmonary:     Effort: Pulmonary effort is normal. No respiratory distress.     Breath sounds: Normal breath sounds and air entry.  Chest:     Chest wall: No deformity, tenderness or crepitus.  Abdominal:     General: There is no distension.     Palpations: Abdomen is soft.     Tenderness: There is no abdominal tenderness. There is no guarding or rebound.  Musculoskeletal:        General:  Normal range of motion.     Cervical back: Normal range of motion and neck supple.       Legs:     Comments: No midline C/T/L spinal tenderness to palpation, no paraspinal muscle tenderness, no deformity, crepitus, or step-off noted.  Bony pelvis stable to compression without pain.  Tenderness at the left hip with small area of erythema present but no skin break.  All other major joints of the bilateral upper and lower extremities mobilized and palpated without pain   Feet:     Right foot:     Protective Sensation: 5 sites tested. 5 sites sensed.     Left foot:     Protective Sensation: 5 sites tested. 5 sites sensed.  Skin:    General: Skin is warm and dry.  Neurological:     Mental Status: He is alert.     GCS: GCS eye subscore is 4. GCS verbal subscore is 5. GCS motor subscore is 6.     Comments: Speech is clear and goal oriented, follows commands Major Cranial nerves without deficit, no facial droop Moves extremities without ataxia, coordination intact  Psychiatric:        Behavior: Behavior normal.     ED Results / Procedures / Treatments   Labs (all labs ordered are listed, but only abnormal results are displayed) Labs Reviewed  CBC WITH DIFFERENTIAL/PLATELET - Abnormal; Notable for the following components:      Result Value   WBC 24.1 (*)    Platelets 511 (*)    Neutro Abs 18.3 (*)    Monocytes Absolute 2.0 (*)    Abs Immature Granulocytes 0.11 (*)    All other components within normal limits  BASIC METABOLIC PANEL - Abnormal; Notable for the following components:   Glucose, Bld 103 (*)    All other components within normal limits  VITAMIN D 25 HYDROXY (VIT D DEFICIENCY, FRACTURES) - Abnormal; Notable for the following components:   Vit D, 25-Hydroxy 17.56 (*)    All other components within normal limits  COMPREHENSIVE METABOLIC PANEL - Abnormal; Notable for the following components:   Glucose, Bld 113 (*)    ALT 60 (*)    All other components within normal limits   CBC WITH DIFFERENTIAL/PLATELET - Abnormal; Notable for the following components:   WBC 20.4 (*)    Platelets 457 (*)    Neutro Abs 12.2 (*)    Lymphs Abs 6.1 (*)    Monocytes Absolute 1.2 (*)    Basophils Absolute 0.4 (*)    All other components within normal limits  RESP PANEL  BY RT-PCR (FLU A&B, COVID) ARPGX2  ETHANOL  URINALYSIS, ROUTINE W REFLEX MICROSCOPIC  LACTIC ACID, PLASMA  PROTIME-INR  APTT  HIV ANTIBODY (ROUTINE TESTING W REFLEX)    EKG None  Radiology DG Chest 1 View  Result Date: 11/05/2020 CLINICAL DATA:  Left intertrochanteric fracture after a fall today. EXAM: CHEST  1 VIEW COMPARISON:  Single-view of the chest 10/24/2018. FINDINGS: Lungs clear. Heart size normal. No pneumothorax or pleural fluid. No acute or focal bony abnormality. IMPRESSION: Negative chest. Electronically Signed   By: Drusilla Kanner M.D.   On: 11/05/2020 20:07   CT ABDOMEN PELVIS W CONTRAST  Result Date: 11/05/2020 CLINICAL DATA:  Abdominal trauma.  Left hip pain. EXAM: CT ABDOMEN AND PELVIS WITH CONTRAST TECHNIQUE: Multidetector CT imaging of the abdomen and pelvis was performed using the standard protocol following bolus administration of intravenous contrast. CONTRAST:  OMNIPAQUE IOHEXOL 300 MG/ML  SOLN COMPARISON:  None. FINDINGS: Lower chest: The lung bases are clear. No pleural fluid, focal airspace disease or basilar pneumothorax. No fracture of the included ribs. Hepatobiliary: No hepatic injury or perihepatic hematoma. Gallbladder is distended. No calcified gallstone or pericholecystic inflammation. There is no biliary dilatation. Pancreas: No evidence of injury. No ductal dilatation or inflammation. Spleen: Splenectomy. Adrenals/Urinary Tract: No adrenal hemorrhage or renal injury identified. Bladder is nondistended but unremarkable. Stomach/Bowel: No evidence of bowel injury or mesenteric hematoma. Stomach physiologically distended. Fluid within nondilated small bowel without wall  thickening or inflammation. Small bowel approaches the right inguinal canal without involvement. Normal appendix. Diverticulosis of the descending and sigmoid colon without diverticulitis. Vascular/Lymphatic: No evidence of vascular injury. Aortic atherosclerosis. 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 No retroperitoneal fluid. There is no adenopathy. Reproductive: Prostatic calcifications. Other: No free air or ascites. Soft tissue edema adjacent to the left hip fracture. No other confluent body wall contusion. Musculoskeletal: Comminuted and displaced left proximal femur fracture which is primarily intertrochanteric and involves both greater and lesser trochanters. There is apex anterior angulation. Fracture extends to the medial cortex of the subtrochanteric femur. The femoral head remains seated. There is adjacent intramuscular hemorrhage without active extravasation or confluent hematoma. No acetabular or pubic rami fractures. No acute fracture of the lumbar spine. Irregularity of the left L3 transverse process may represent remote fracture. IMPRESSION: 1. Comminuted and displaced left proximal femur fracture which is primarily intertrochanteric and involves both greater and lesser trochanters. Fracture extends to the medial cortex of the subtrochanteric femur. 2. No additional acute traumatic injury to the abdomen or pelvis. 3. Colonic diverticulosis without diverticulitis. 4. Duplicated IVC. Aortic Atherosclerosis (ICD10-I70.0). Electronically Signed   By: Narda Rutherford M.D.   On: 11/05/2020 20:04   DG Hip Unilat With Pelvis 2-3 Views Left  Result Date: 11/05/2020 CLINICAL DATA:  Left hip pain since the patient fell off a truck today. Initial encounter. EXAM: DG HIP (WITH OR WITHOUT PELVIS) 2-3V LEFT COMPARISON:  None. FINDINGS: The patient has an acute intertrochanteric fracture of the left hip. There is subtrochanteric extension  medially. The femoral head is located. No other abnormality is identified. IMPRESSION: Acute intertrochanteric fracture with subtrochanteric extension medially. Electronically Signed   By: Drusilla Kanner M.D.   On: 11/05/2020 17:18   DG Femur Min 2 Views Left  Result Date: 11/05/2020 CLINICAL DATA:  Status post fall today.  Initial encounter. EXAM: LEFT FEMUR 2 VIEWS COMPARISON:  None. FINDINGS: There is no evidence of fracture  or other focal bone lesions. Soft tissues are unremarkable. IMPRESSION: Negative exam. Electronically Signed   By: Drusilla Kanner M.D.   On: 11/05/2020 20:07    Procedures Procedures (including critical care time)  Medications Ordered in ED Medications  HYDROmorphone (DILAUDID) injection 1 mg (1 mg Intravenous Given 11/06/20 0557)    Or  HYDROmorphone (DILAUDID) injection 0.5 mg ( Intravenous See Alternative 11/06/20 0557)  lactated ringers infusion ( Intravenous Infusion Verify 11/06/20 0600)  acetaminophen (TYLENOL) tablet 650 mg (has no administration in time range)    Or  acetaminophen (TYLENOL) suppository 650 mg (has no administration in time range)  polyethylene glycol (MIRALAX / GLYCOLAX) packet 17 g (has no administration in time range)  ondansetron (ZOFRAN) tablet 4 mg (has no administration in time range)    Or  ondansetron (ZOFRAN) injection 4 mg (has no administration in time range)  nicotine (NICODERM CQ - dosed in mg/24 hours) patch 21 mg (21 mg Transdermal Patch Applied 11/06/20 0244)  fentaNYL (SUBLIMAZE) injection 50 mcg (50 mcg Intravenous Given 11/05/20 1739)  HYDROmorphone (DILAUDID) injection 1 mg (1 mg Intravenous Given 11/05/20 1805)  HYDROmorphone (DILAUDID) injection 1 mg (1 mg Intravenous Given 11/05/20 1859)  iohexol (OMNIPAQUE) 300 MG/ML solution 100 mL (100 mLs Intravenous Contrast Given 11/05/20 1940)  HYDROmorphone (DILAUDID) injection 1 mg (1 mg Intravenous Given 11/05/20 2005)  HYDROmorphone (DILAUDID) injection 1 mg (1 mg  Intravenous Given 11/05/20 2140)  HYDROmorphone (DILAUDID) injection 1 mg (1 mg Intravenous Given 11/05/20 2226)  HYDROmorphone (DILAUDID) injection 1 mg (1 mg Intravenous Given 11/06/20 0805)    ED Course  I have reviewed the triage vital signs and the nursing notes.  Pertinent labs & imaging results that were available during my care of the patient were reviewed by me and considered in my medical decision making (see chart for details).  Clinical Course as of 11/06/20 1007  Fri Nov 05, 2020  2026 Spoke to Dr Ophelia Charter who recommends transfer to [facility], NPO at midnight, will likely need OR tomorrow [MT]    Clinical Course User Index [MT] Trifan, Kermit Balo, MD   MDM Rules/Calculators/A&P                         Additional history obtained from: 1. Nursing notes from this visit. 2. Review of electronic medical records. --------------- 44 year old male presented after mechanical fall onto left hip just prior to arrival. ABC intact. Pain of the left hip no other injury. No evidence of injury to the head neck back chest abdomen or other three extremities. Patient NVI to LLE. Xray in triage shows left intertrochanteric fracture. Will obtain basic labs as well as CT AP and xray of the remainder of the femur and screening chest xray. No indication for CT head per [place] criteria. No indication for imaging of the cervical spine or chest.  - CBC with leukocytosis, suspect secondary from injury, no anemia. BMP without emergent electroylte derangement aki or gap. COVID/Flue panel negative. Ethanol negative. Lactic wnl  Patient reassessed resting comfortably nad, pain controlled with dilaudid. - CT AP and remainder of imaging pending at shift change. Care handoff given to Berle Mull PA-C and Dr. Renaye Rakers. Plan is to follow-up on scans and consult ortho. Anticipate admisson. Final disposition per oncoming team.    Note: Portions of this report may have been transcribed using voice recognition  software. Every effort was made to ensure accuracy; however, inadvertent computerized transcription errors may still be present. Final  Clinical Impression(s) / ED Diagnoses Final diagnoses:  Closed fracture of left femur, unspecified fracture morphology, unspecified portion of femur, initial encounter [facility])    Rx / DC Orders ED Discharge Orders    None         Elizabeth Palau 11/06/20 1007    Terald Sleeper, MD 11/06/20 1123

## 2020-11-06 ENCOUNTER — Inpatient Hospital Stay (HOSPITAL_COMMUNITY): Payer: Worker's Compensation | Admitting: Anesthesiology

## 2020-11-06 ENCOUNTER — Encounter (HOSPITAL_COMMUNITY): Payer: Self-pay | Admitting: Family Medicine

## 2020-11-06 ENCOUNTER — Inpatient Hospital Stay (HOSPITAL_COMMUNITY): Payer: Worker's Compensation

## 2020-11-06 ENCOUNTER — Encounter (HOSPITAL_COMMUNITY)
Admission: EM | Disposition: A | Discharge status: Home / Self Care | Payer: Self-pay | Source: Home / Self Care | Attending: Family Medicine

## 2020-11-06 DIAGNOSIS — D72829 Elevated white blood cell count, unspecified: Secondary | ICD-10-CM | POA: Diagnosis present

## 2020-11-06 DIAGNOSIS — S7292XA Unspecified fracture of left femur, initial encounter for closed fracture: Secondary | ICD-10-CM

## 2020-11-06 DIAGNOSIS — S72002A Fracture of unspecified part of neck of left femur, initial encounter for closed fracture: Secondary | ICD-10-CM

## 2020-11-06 DIAGNOSIS — F17210 Nicotine dependence, cigarettes, uncomplicated: Secondary | ICD-10-CM

## 2020-11-06 HISTORY — PX: INTRAMEDULLARY (IM) NAIL INTERTROCHANTERIC: SHX5875

## 2020-11-06 LAB — CBC WITH DIFFERENTIAL/PLATELET
Abs Immature Granulocytes: 0 10*3/uL (ref 0.00–0.07)
Basophils Absolute: 0.4 10*3/uL — ABNORMAL HIGH (ref 0.0–0.1)
Basophils Relative: 2 %
Eosinophils Absolute: 0.4 10*3/uL (ref 0.0–0.5)
Eosinophils Relative: 2 %
HCT: 42.5 % (ref 39.0–52.0)
Hemoglobin: 14 g/dL (ref 13.0–17.0)
Lymphocytes Relative: 30 %
Lymphs Abs: 6.1 10*3/uL — ABNORMAL HIGH (ref 0.7–4.0)
MCH: 31.9 pg (ref 26.0–34.0)
MCHC: 32.9 g/dL (ref 30.0–36.0)
MCV: 96.8 fL (ref 80.0–100.0)
Monocytes Absolute: 1.2 10*3/uL — ABNORMAL HIGH (ref 0.1–1.0)
Monocytes Relative: 6 %
Neutro Abs: 12.2 10*3/uL — ABNORMAL HIGH (ref 1.7–7.7)
Neutrophils Relative %: 60 %
Platelets: 457 10*3/uL — ABNORMAL HIGH (ref 150–400)
RBC: 4.39 MIL/uL (ref 4.22–5.81)
RDW: 13.4 % (ref 11.5–15.5)
WBC: 20.4 10*3/uL — ABNORMAL HIGH (ref 4.0–10.5)
nRBC: 0 % (ref 0.0–0.2)

## 2020-11-06 LAB — COMPREHENSIVE METABOLIC PANEL
ALT: 60 U/L — ABNORMAL HIGH (ref 0–44)
AST: 37 U/L (ref 15–41)
Albumin: 4 g/dL (ref 3.5–5.0)
Alkaline Phosphatase: 64 U/L (ref 38–126)
Anion gap: 11 (ref 5–15)
BUN: 10 mg/dL (ref 6–20)
CO2: 26 mmol/L (ref 22–32)
Calcium: 9.1 mg/dL (ref 8.9–10.3)
Chloride: 100 mmol/L (ref 98–111)
Creatinine, Ser: 0.77 mg/dL (ref 0.61–1.24)
GFR, Estimated: >60 mL/min (ref >60)
Glucose, Bld: 113 mg/dL — ABNORMAL HIGH (ref 70–99)
Potassium: 3.8 mmol/L (ref 3.5–5.1)
Sodium: 137 mmol/L (ref 135–145)
Total Bilirubin: 0.9 mg/dL (ref 0.3–1.2)
Total Protein: 7.1 g/dL (ref 6.5–8.1)

## 2020-11-06 LAB — VITAMIN D 25 HYDROXY (VIT D DEFICIENCY, FRACTURES): Vit D, 25-Hydroxy: 17.56 ng/mL — ABNORMAL LOW (ref 30–100)

## 2020-11-06 LAB — PROTIME-INR
INR: 1 (ref 0.8–1.2)
Prothrombin Time: 12.3 seconds (ref 11.4–15.2)

## 2020-11-06 LAB — RAPID URINE DRUG SCREEN, HOSP PERFORMED
Amphetamines: NOT DETECTED
Barbiturates: NOT DETECTED
Benzodiazepines: POSITIVE — AB
Cocaine: POSITIVE — AB
Opiates: POSITIVE — AB
Tetrahydrocannabinol: POSITIVE — AB

## 2020-11-06 LAB — APTT: aPTT: 31 seconds (ref 24–36)

## 2020-11-06 LAB — HIV ANTIBODY (ROUTINE TESTING W REFLEX): HIV Screen 4th Generation wRfx: NONREACTIVE

## 2020-11-06 SURGERY — FIXATION, FRACTURE, INTERTROCHANTERIC, WITH INTRAMEDULLARY ROD
Anesthesia: General | Site: Hip | Laterality: Left

## 2020-11-06 MED ORDER — PROPOFOL 10 MG/ML IV BOLUS
INTRAVENOUS | Status: DC | PRN
  Administered 2020-11-06: 200 mg via INTRAVENOUS

## 2020-11-06 MED ORDER — SUCCINYLCHOLINE CHLORIDE 200 MG/10ML IV SOSY
PREFILLED_SYRINGE | INTRAVENOUS | Status: DC | PRN
  Administered 2020-11-06: 100 mg via INTRAVENOUS

## 2020-11-06 MED ORDER — POLYETHYLENE GLYCOL 3350 17 G PO PACK: 17.0000 g | PACK | Freq: Every day | ORAL | Status: DC | PRN

## 2020-11-06 MED ORDER — ACETAMINOPHEN 325 MG PO TABS: 650.0000 mg | ORAL_TABLET | Freq: Four times a day (QID) | ORAL | Status: DC | PRN

## 2020-11-06 MED ORDER — MIDAZOLAM HCL 5 MG/5ML IJ SOLN
INTRAMUSCULAR | Status: DC | PRN
  Administered 2020-11-06: 2 mg via INTRAVENOUS

## 2020-11-06 MED ORDER — DEXMEDETOMIDINE HCL IN NACL 200 MCG/50ML IV SOLN: 0.2000 ug/kg/h | INTRAVENOUS | Status: DC

## 2020-11-06 MED ORDER — DEXAMETHASONE SODIUM PHOSPHATE 10 MG/ML IJ SOLN
INTRAMUSCULAR | Status: AC
  Filled 2020-11-06: qty 1

## 2020-11-06 MED ORDER — PROPOFOL 10 MG/ML IV BOLUS
INTRAVENOUS | Status: AC
  Filled 2020-11-06: qty 20

## 2020-11-06 MED ORDER — FENTANYL CITRATE (PF) 100 MCG/2ML IJ SOLN
25.0000 ug | INTRAMUSCULAR | Status: DC | PRN
  Administered 2020-11-06 (×3): 50 ug via INTRAVENOUS

## 2020-11-06 MED ORDER — METOCLOPRAMIDE HCL 5 MG/ML IJ SOLN: 5.0000 mg | Freq: Three times a day (TID) | INTRAMUSCULAR | Status: DC | PRN

## 2020-11-06 MED ORDER — CEFAZOLIN SODIUM-DEXTROSE 2-4 GM/100ML-% IV SOLN
INTRAVENOUS | Status: AC
  Filled 2020-11-06: qty 100

## 2020-11-06 MED ORDER — PHENOL 1.4 % MT LIQD: 1.0000 | OROMUCOSAL | Status: DC | PRN

## 2020-11-06 MED ORDER — MIDAZOLAM HCL 2 MG/2ML IJ SOLN
INTRAMUSCULAR | Status: AC
  Filled 2020-11-06: qty 2

## 2020-11-06 MED ORDER — ROCURONIUM BROMIDE 10 MG/ML (PF) SYRINGE
PREFILLED_SYRINGE | INTRAVENOUS | Status: AC
  Filled 2020-11-06: qty 10

## 2020-11-06 MED ORDER — HYDROMORPHONE HCL 1 MG/ML IJ SOLN
INTRAMUSCULAR | Status: AC
  Filled 2020-11-06: qty 1

## 2020-11-06 MED ORDER — FENTANYL CITRATE (PF) 100 MCG/2ML IJ SOLN
INTRAMUSCULAR | Status: DC | PRN
  Administered 2020-11-06: 50 ug via INTRAVENOUS
  Administered 2020-11-06: 100 ug via INTRAVENOUS
  Administered 2020-11-06 (×2): 50 ug via INTRAVENOUS

## 2020-11-06 MED ORDER — NICOTINE 21 MG/24HR TD PT24
21.0000 mg | MEDICATED_PATCH | Freq: Every day | TRANSDERMAL | Status: DC
  Administered 2020-11-06: 21 mg via TRANSDERMAL
  Filled 2020-11-06: qty 1

## 2020-11-06 MED ORDER — DEXAMETHASONE SODIUM PHOSPHATE 10 MG/ML IJ SOLN
INTRAMUSCULAR | Status: DC | PRN
  Administered 2020-11-06: 10 mg via INTRAVENOUS

## 2020-11-06 MED ORDER — SUGAMMADEX SODIUM 200 MG/2ML IV SOLN
INTRAVENOUS | Status: DC | PRN
  Administered 2020-11-06: 200 mg via INTRAVENOUS

## 2020-11-06 MED ORDER — KETOROLAC TROMETHAMINE 30 MG/ML IJ SOLN
INTRAMUSCULAR | Status: AC
  Filled 2020-11-06: qty 1

## 2020-11-06 MED ORDER — CEFAZOLIN SODIUM-DEXTROSE 2-4 GM/100ML-% IV SOLN
2.0000 g | Freq: Once | INTRAVENOUS | Status: AC
  Administered 2020-11-06: 2 g via INTRAVENOUS

## 2020-11-06 MED ORDER — ACETAMINOPHEN 10 MG/ML IV SOLN
1000.0000 mg | Freq: Once | INTRAVENOUS | Status: AC
  Administered 2020-11-06: 1000 mg via INTRAVENOUS

## 2020-11-06 MED ORDER — OXYCODONE HCL 5 MG PO TABS: 5.0000 mg | ORAL_TABLET | ORAL | Status: DC | PRN

## 2020-11-06 MED ORDER — METOCLOPRAMIDE HCL 5 MG PO TABS
5.0000 mg | ORAL_TABLET | Freq: Three times a day (TID) | ORAL | Status: DC | PRN
Start: 2020-11-06 — End: 2020-11-07

## 2020-11-06 MED ORDER — DOCUSATE SODIUM 100 MG PO CAPS
100.0000 mg | ORAL_CAPSULE | Freq: Two times a day (BID) | ORAL | Status: DC
  Administered 2020-11-06 – 2020-11-07 (×2): 100 mg via ORAL
  Filled 2020-11-06 (×2): qty 1

## 2020-11-06 MED ORDER — OXYCODONE HCL 5 MG PO TABS
10.0000 mg | ORAL_TABLET | ORAL | Status: DC | PRN
  Administered 2020-11-06 – 2020-11-07 (×4): 15 mg via ORAL
  Filled 2020-11-06 (×4): qty 3

## 2020-11-06 MED ORDER — CHLORHEXIDINE GLUCONATE 4 % EX LIQD: 60.0000 mL | Freq: Once | CUTANEOUS | Status: DC

## 2020-11-06 MED ORDER — DEXMEDETOMIDINE (PRECEDEX) IN NS 20 MCG/5ML (4 MCG/ML) IV SYRINGE
PREFILLED_SYRINGE | INTRAVENOUS | Status: DC | PRN
  Administered 2020-11-06: 16 ug via INTRAVENOUS

## 2020-11-06 MED ORDER — BUPIVACAINE-EPINEPHRINE (PF) 0.25% -1:200000 IJ SOLN
INTRAMUSCULAR | Status: AC
  Filled 2020-11-06: qty 30

## 2020-11-06 MED ORDER — SUCCINYLCHOLINE CHLORIDE 200 MG/10ML IV SOSY
PREFILLED_SYRINGE | INTRAVENOUS | Status: AC
  Filled 2020-11-06: qty 10

## 2020-11-06 MED ORDER — FENTANYL CITRATE (PF) 100 MCG/2ML IJ SOLN
INTRAMUSCULAR | Status: AC
  Filled 2020-11-06: qty 2

## 2020-11-06 MED ORDER — 0.9 % SODIUM CHLORIDE (POUR BTL) OPTIME
TOPICAL | Status: DC | PRN
  Administered 2020-11-06: 1000 mL

## 2020-11-06 MED ORDER — ONDANSETRON HCL 4 MG/2ML IJ SOLN
4.0000 mg | Freq: Four times a day (QID) | INTRAMUSCULAR | Status: DC | PRN
  Administered 2020-11-06: 4 mg via INTRAVENOUS

## 2020-11-06 MED ORDER — AMISULPRIDE (ANTIEMETIC) 5 MG/2ML IV SOLN: 10.0000 mg | Freq: Once | INTRAVENOUS | Status: DC | PRN

## 2020-11-06 MED ORDER — ASPIRIN EC 325 MG PO TBEC
325.0000 mg | DELAYED_RELEASE_TABLET | Freq: Every day | ORAL | Status: DC
  Administered 2020-11-07: 325 mg via ORAL
  Filled 2020-11-06: qty 1

## 2020-11-06 MED ORDER — ACETAMINOPHEN 650 MG RE SUPP: 650.0000 mg | Freq: Four times a day (QID) | RECTAL | Status: DC | PRN

## 2020-11-06 MED ORDER — FENTANYL CITRATE (PF) 250 MCG/5ML IJ SOLN
INTRAMUSCULAR | Status: AC
  Filled 2020-11-06: qty 5

## 2020-11-06 MED ORDER — POVIDONE-IODINE 10 % EX SWAB: 2.0000 "application " | Freq: Once | CUTANEOUS | Status: DC

## 2020-11-06 MED ORDER — ONDANSETRON HCL 4 MG/2ML IJ SOLN: 4.0000 mg | Freq: Four times a day (QID) | INTRAMUSCULAR | Status: DC | PRN

## 2020-11-06 MED ORDER — ROCURONIUM BROMIDE 10 MG/ML (PF) SYRINGE
PREFILLED_SYRINGE | INTRAVENOUS | Status: DC | PRN
  Administered 2020-11-06: 20 mg via INTRAVENOUS
  Administered 2020-11-06: 50 mg via INTRAVENOUS

## 2020-11-06 MED ORDER — ONDANSETRON HCL 4 MG PO TABS: 4.0000 mg | ORAL_TABLET | Freq: Four times a day (QID) | ORAL | Status: DC | PRN

## 2020-11-06 MED ORDER — ACETAMINOPHEN 10 MG/ML IV SOLN
INTRAVENOUS | Status: AC
  Filled 2020-11-06: qty 100

## 2020-11-06 MED ORDER — EPHEDRINE 5 MG/ML INJ
INTRAVENOUS | Status: AC
  Filled 2020-11-06: qty 10

## 2020-11-06 MED ORDER — BUPIVACAINE-EPINEPHRINE (PF) 0.25% -1:200000 IJ SOLN
INTRAMUSCULAR | Status: DC | PRN
  Administered 2020-11-06: 15 mL

## 2020-11-06 MED ORDER — ACETAMINOPHEN 325 MG PO TABS: 325.0000 mg | ORAL_TABLET | Freq: Four times a day (QID) | ORAL | Status: DC | PRN

## 2020-11-06 MED ORDER — HYDROMORPHONE HCL 1 MG/ML IJ SOLN
INTRAMUSCULAR | Status: AC
  Administered 2020-11-06: 1 mg
  Filled 2020-11-06: qty 1

## 2020-11-06 MED ORDER — CEFAZOLIN SODIUM-DEXTROSE 2-4 GM/100ML-% IV SOLN: 2.0000 g | INTRAVENOUS | Status: DC

## 2020-11-06 MED ORDER — HYDROMORPHONE HCL 1 MG/ML IJ SOLN
1.0000 mg | Freq: Once | INTRAMUSCULAR | Status: AC
  Administered 2020-11-06: 1 mg via INTRAVENOUS
  Filled 2020-11-06: qty 1

## 2020-11-06 MED ORDER — KETOROLAC TROMETHAMINE 30 MG/ML IJ SOLN
30.0000 mg | Freq: Once | INTRAMUSCULAR | Status: AC
  Administered 2020-11-06: 30 mg via INTRAVENOUS

## 2020-11-06 MED ORDER — TRAMADOL HCL 50 MG PO TABS
50.0000 mg | ORAL_TABLET | Freq: Four times a day (QID) | ORAL | Status: DC
Start: 2020-11-06 — End: 2020-11-07
  Administered 2020-11-06 – 2020-11-07 (×3): 50 mg via ORAL
  Filled 2020-11-06 (×3): qty 1

## 2020-11-06 MED ORDER — ONDANSETRON HCL 4 MG/2ML IJ SOLN
INTRAMUSCULAR | Status: AC
  Filled 2020-11-06: qty 2

## 2020-11-06 MED ORDER — HYDROMORPHONE HCL 1 MG/ML IJ SOLN
0.5000 mg | INTRAMUSCULAR | Status: AC | PRN
  Administered 2020-11-06 (×4): 0.5 mg via INTRAVENOUS

## 2020-11-06 MED ORDER — SODIUM CHLORIDE 0.9 % IV SOLN: INTRAVENOUS | Status: DC

## 2020-11-06 MED ORDER — HYDROMORPHONE HCL 1 MG/ML IJ SOLN: 0.5000 mg | INTRAMUSCULAR | Status: DC | PRN

## 2020-11-06 MED ORDER — MENTHOL 3 MG MT LOZG: 1.0000 | LOZENGE | OROMUCOSAL | Status: DC | PRN

## 2020-11-06 MED ORDER — LIDOCAINE 2% (20 MG/ML) 5 ML SYRINGE
INTRAMUSCULAR | Status: DC | PRN
  Administered 2020-11-06: 50 mg via INTRAVENOUS

## 2020-11-06 MED ORDER — HYDROMORPHONE HCL 1 MG/ML IJ SOLN
0.5000 mg | INTRAMUSCULAR | Status: DC | PRN
  Administered 2020-11-06 (×3): 0.5 mg via INTRAVENOUS

## 2020-11-06 MED ORDER — NICOTINE 21 MG/24HR TD PT24
21.0000 mg | MEDICATED_PATCH | Freq: Every day | TRANSDERMAL | Status: DC
  Administered 2020-11-06 – 2020-11-07 (×2): 21 mg via TRANSDERMAL
  Filled 2020-11-06 (×2): qty 1

## 2020-11-06 SURGICAL SUPPLY — 44 items
BIT DRILL CANN LG 4.3MM (BIT) IMPLANT
BNDG COHESIVE 6X5 TAN STRL LF (GAUZE/BANDAGES/DRESSINGS) ×2 IMPLANT
COVER PERINEAL POST (MISCELLANEOUS) ×2 IMPLANT
COVER SURGICAL LIGHT HANDLE (MISCELLANEOUS) ×2 IMPLANT
COVER WAND RF STERILE (DRAPES) IMPLANT
DRAPE C-ARM 42X120 X-RAY (DRAPES) ×2 IMPLANT
DRAPE C-ARMOR (DRAPES) ×2 IMPLANT
DRAPE STERI IOBAN 125X83 (DRAPES) ×2 IMPLANT
DRILL BIT CANN LG 4.3MM (BIT) ×4
DRSG PAD ABDOMINAL 8X10 ST (GAUZE/BANDAGES/DRESSINGS) ×2 IMPLANT
DURAPREP 26ML APPLICATOR (WOUND CARE) IMPLANT
ELECT REM PT RETURN 15FT ADLT (MISCELLANEOUS) ×2 IMPLANT
GAUZE SPONGE 4X4 12PLY STRL (GAUZE/BANDAGES/DRESSINGS) ×2 IMPLANT
GLOVE BIOGEL M 6.5 STRL (GLOVE) ×1 IMPLANT
GLOVE BIOGEL PI IND STRL 7.0 (GLOVE) IMPLANT
GLOVE BIOGEL PI IND STRL 7.5 (GLOVE) IMPLANT
GLOVE BIOGEL PI INDICATOR 7.0 (GLOVE) ×1
GLOVE BIOGEL PI INDICATOR 7.5 (GLOVE) ×1
GLOVE INDICATOR 8.0 STRL GRN (GLOVE) ×2 IMPLANT
GLOVE ORTHO TXT STRL SZ7.5 (GLOVE) ×2 IMPLANT
GOWN STRL REUS W/ TWL LRG LVL3 (GOWN DISPOSABLE) IMPLANT
GOWN STRL REUS W/TWL LRG LVL3 (GOWN DISPOSABLE) ×1
GOWN STRL REUS W/TWL XL LVL3 (GOWN DISPOSABLE) ×2 IMPLANT
GUIDEPIN 3.2X17.5 THRD DISP (PIN) ×1 IMPLANT
KIT BASIN OR (CUSTOM PROCEDURE TRAY) ×2 IMPLANT
KIT TURNOVER KIT A (KITS) ×1 IMPLANT
MANIFOLD NEPTUNE II (INSTRUMENTS) ×2 IMPLANT
NAIL HIP FRACT 130D 11X180 (Screw) ×1 IMPLANT
NEEDLE HYPO 22GX1.5 SAFETY (NEEDLE) ×2 IMPLANT
PACK GENERAL/GYN (CUSTOM PROCEDURE TRAY) ×2 IMPLANT
PAD CAST 4YDX4 CTTN HI CHSV (CAST SUPPLIES) ×1 IMPLANT
PADDING CAST COTTON 4X4 STRL (CAST SUPPLIES) ×1
PROTECTOR NERVE ULNAR (MISCELLANEOUS) ×4 IMPLANT
SCREW ANTI ROTATION 80MM (Screw) ×1 IMPLANT
SCREW BONE CORTICAL 5.0X40 (Screw) ×1 IMPLANT
SCREW DRILL BIT ANIT ROTATION (BIT) ×1 IMPLANT
SCREW LAG 10.5MMX105MM HFN (Screw) ×1 IMPLANT
STAPLER VISISTAT 35W (STAPLE) ×2 IMPLANT
SUT VIC AB 1 CT1 36 (SUTURE) ×4 IMPLANT
SUT VIC AB 2-0 CT1 27 (SUTURE) ×1
SUT VIC AB 2-0 CT1 TAPERPNT 27 (SUTURE) ×1 IMPLANT
SYR 20ML LL LF (SYRINGE) ×2 IMPLANT
TAPE CLOTH 4X10 WHT NS (GAUZE/BANDAGES/DRESSINGS) ×1 IMPLANT
TOWEL OR 17X26 10 PK STRL BLUE (TOWEL DISPOSABLE) ×4 IMPLANT

## 2020-11-06 NOTE — Anesthesia Postprocedure Evaluation (Signed)
Anesthesia Post Note  Patient: Aaron Berger  Procedure(s) Performed: LEFT INTRAMEDULLARY (IM) NAIL INTERTROCHANTRIC (Left Hip)     Patient location during evaluation: PACU Anesthesia Type: General Level of consciousness: awake and alert Pain management: pain level controlled Vital Signs Assessment: post-procedure vital signs reviewed and stable Respiratory status: spontaneous breathing, nonlabored ventilation, respiratory function stable and patient connected to nasal cannula oxygen Cardiovascular status: blood pressure returned to baseline and stable Postop Assessment: no apparent nausea or vomiting Anesthetic complications: no   No complications documented.  Last Vitals:  Vitals:   11/06/20 1856 11/06/20 1940  BP: (!) 163/93 (!) 136/94  Pulse: 88 93  Resp:  17  Temp:  37.1 C  SpO2:  96%    Last Pain:  Vitals:   11/06/20 1940  TempSrc: Oral  PainSc:                  [name]

## 2020-11-06 NOTE — Op Note (Signed)
Preop diagnosis: Left intertrochanteric hip fracture.  Postop diagnosis: Same  Procedure: Left trochanteric intramedullary nail with interlock for left intertrochanteric hip fracture.  Surgeon: Annell Greening, MD  Anesthesia G OT.  Plus Marcaine skin local  EBL 200 cc  Implants:[employer] Affixis short 47mm nail 105 lag screww, 70mm derotation screw, 40 mm distal interlock  Procedure: After induction of general anesthesia, table well leg holder preoperative Ancef prophylaxis standard prepping with DuraPrep after reduction.  Large shower curtain was applied timeout procedure completed.  Incision was made proximal trochanter it was palpated after reduction tip was palpated pin was placed impacted with hammer drilled and then overreamed.  Short nail was leg millimeters was selected patient appeared to have a widened medullary canal had extremely hard bone at age 44.  Nail was started and then rotated as it advanced.  Pin was drilled up low in the neck on AP and slightly posterior on the lateral.  Only measured and placed a 105 screw it looked like it was extremely close to the inferior neck I was concerned is close it was my distress riser so we backed the screw up and put it middle on AP and slightly posterior on lateral image.  80 mm derotational screw again was used and a 40 mm interlock screw was placed bicortical.  Final spot pictures showed good position alignment.  Irrigation closure with deep fascia with #1 Vicryl 2-0 Vicryl subtendinous tissue skin staple closure 15 cc Marcaine with epi local at the end of the case for postoperative analgesia and transfer the care room in stable condition.

## 2020-11-06 NOTE — Anesthesia Procedure Notes (Addendum)
Procedure Name: Intubation Date/Time: 11/06/2020 2:22 PM Performed by: Gerald Leitz, CRNA Pre-anesthesia Checklist: Patient identified, Patient being monitored, Timeout performed, Emergency Drugs available and Suction available Patient Re-evaluated:Patient Re-evaluated prior to induction Oxygen Delivery Method: Circle system utilized Preoxygenation: Pre-oxygenation with 100% oxygen Induction Type: IV induction Ventilation: Mask ventilation without difficulty Laryngoscope Size: Mac and 3 Grade View: Grade I Tube type: Oral Tube size: 7.5 mm Number of attempts: 1 Airway Equipment and Method: Stylet Placement Confirmation: ETT inserted through vocal cords under direct vision,  positive ETCO2 and breath sounds checked- equal and bilateral Secured at: 23 cm Tube secured with: Tape Dental Injury: Teeth and Oropharynx as per pre-operative assessment
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
## 2020-11-06 NOTE — H&P (Addendum)
History and Physical    Aaron Berger PFX:[id number] DOB: 1976-10-12 DOA: 11/05/2020  PCP: Patient, No Pcp Per  Patient coming from: [facility]    Chief Complaint:  Chief Complaint  Patient presents with  . Fall     HPI:    44 year old male with past medical history of nicotine dependence who presents as a transfer from med [employer] status post fall with left proximal femur fracture.  Patient explains that he was standing on his work truck yesterday, pulling a "garage door rope."  As patient was pulling back on the rope, he fell backwards and landed on his left hip.  Patient felt a pop and immediate onset of severe sharp pain.  Pain is nonradiating, worse with movement and ongoing until the patient presented to med [facility] emergency department for evaluation.  Upon evaluation at [facility] emergency department, patient underwent both CT imaging of the abdomen pelvis as well as hip x-rays with CT revealing commuted and displaced left proximal femur fracture, primarily intratrochanteric.  Case was then discussed with Dr. Ophelia Charter with orthopedic surgery at the time who agreed to take the patient for operative intervention on 12/18 at [facility].  Hospitalist group has now been called and the patient has explained accepted to the medical floor at [facility].  Review of Systems:   Review of Systems  Musculoskeletal: Positive for falls and joint pain.    History reviewed. No pertinent past medical history.  Past Surgical History:  Procedure Laterality Date  . SPLENECTOMY       reports that he has been smoking cigarettes. He has been smoking about 1.00 pack per day. He has never used smokeless tobacco. He reports current alcohol use. He reports that he does not use drugs.  No Known Allergies  Family History  Problem Relation Age of Onset  . Cirrhosis Mother      Prior to Admission medications   Medication Sig Start Date End Date Taking?  Authorizing Provider  cyclobenzaprine (FLEXERIL) 10 MG tablet Take 1 tablet (10 mg total) by mouth 2 (two) times daily as needed for muscle spasms. Patient not taking: No sig reported 11/25/19   Bethel Born, PA-C  predniSONE (DELTASONE) 20 MG tablet Take 2 tablets (40 mg total) by mouth daily. Patient not taking: No sig reported 11/25/19   Bethel Born, PA-C    Physical Exam: Vitals:   11/05/20 2241 11/05/20 2325 11/06/20 0020 11/06/20 0433  BP: 125/80 (!) 133/92 (!) 133/92 (!) 126/92  Pulse: 82 76 76 75  Resp: 16 16 16 16   Temp: 97.8 F (36.6 C) 98.4 F (36.9 C) 98.4 F (36.9 C) 98.3 F (36.8 C)  TempSrc: Oral Oral Oral Oral  SpO2: 99% 92% 92% 96%  Weight:   79.4 kg   Height:   6' (1.829 m)     Constitutional: Acute alert and oriented x3, patient is in distress due to left hip pain. Skin: no rashes, no lesions, good skin turgor noted. Eyes: Pupils are equally reactive to light.  No evidence of scleral icterus or conjunctival pallor.  ENMT: Moist mucous membranes noted.  Posterior pharynx clear of any exudate or lesions.   Neck: normal, supple, no masses, no thyromegaly.  No evidence of jugular venous distension.   Respiratory: clear to auscultation bilaterally, no wheezing, no crackles. Normal respiratory effort. No accessory muscle use.  Cardiovascular: Regular rate and rhythm, no murmurs / rubs / gallops. No extremity edema. 2+ pedal  pulses. No carotid bruits.  Chest:   Nontender without crepitus or deformity.   Back:   Nontender without crepitus or deformity. Abdomen: Abdomen is soft and nontender.  No evidence of intra-abdominal masses.  Positive bowel sounds noted in all quadrants.   Musculoskeletal: Notable deformity of the left hip.  Severe pain with both passive and active range of motion of the left hip joint.   no contractures. Normal muscle tone.  Neurologic: CN 2-12 grossly intact. Sensation intact.  Patient moving all 4 extremities spontaneously.  Patient is  following all commands.  Patient is responsive to verbal stimuli.   Psychiatric: Patient exhibits normal mood with appropriate affect.  Patient seems to possess insight as to their current situation.     Labs on Admission: I have personally reviewed following labs and imaging studies -   CBC: Recent Labs  Lab 11/05/20 1826 11/06/20 0249  WBC 24.1* 20.4*  NEUTROABS 18.3* 12.2*  HGB 15.4 14.0  HCT 44.8 42.5  MCV 94.9 96.8  PLT 511* 457*   Basic Metabolic Panel: Recent Labs  Lab 11/05/20 1826 11/06/20 0249  NA 137 137  K 3.8 3.8  CL 101 100  CO2 23 26  GLUCOSE 103* 113*  BUN 10 10  CREATININE 0.74 0.77  CALCIUM 9.4 9.1   GFR: Estimated Creatinine Clearance: 128 mL/min (by C-G formula based on SCr of 0.77 mg/dL). Liver Function Tests: Recent Labs  Lab 11/06/20 0249  AST 37  ALT 60*  ALKPHOS 64  BILITOT 0.9  PROT 7.1  ALBUMIN 4.0   No results for input(s): LIPASE, AMYLASE in the last 168 hours. No results for input(s): AMMONIA in the last 168 hours. Coagulation Profile: Recent Labs  Lab 11/06/20 0249  INR 1.0   Cardiac Enzymes: No results for input(s): CKTOTAL, CKMB, CKMBINDEX, TROPONINI in the last 168 hours. BNP (last 3 results) No results for input(s): PROBNP in the last 8760 hours. HbA1C: No results for input(s): HGBA1C in the last 72 hours. CBG: No results for input(s): GLUCAP in the last 168 hours. Lipid Profile: No results for input(s): CHOL, HDL, LDLCALC, TRIG, CHOLHDL, LDLDIRECT in the last 72 hours. Thyroid Function Tests: No results for input(s): TSH, T4TOTAL, FREET4, T3FREE, THYROIDAB in the last 72 hours. Anemia Panel: No results for input(s): VITAMINB12, FOLATE, FERRITIN, TIBC, IRON, RETICCTPCT in the last 72 hours. Urine analysis:    Component Value Date/Time   COLORURINE YELLOW 11/05/2020 1826   APPEARANCEUR CLEAR 11/05/2020 1826   LABSPEC 1.020 11/05/2020 1826   PHURINE 6.0 11/05/2020 1826   GLUCOSEU NEGATIVE 11/05/2020 1826    HGBUR NEGATIVE 11/05/2020 1826   BILIRUBINUR NEGATIVE 11/05/2020 1826   KETONESUR NEGATIVE 11/05/2020 1826   PROTEINUR NEGATIVE 11/05/2020 1826   NITRITE NEGATIVE 11/05/2020 1826   LEUKOCYTESUR NEGATIVE 11/05/2020 1826    Radiological Exams on Admission - Personally Reviewed: DG Chest 1 View  Result Date: 11/05/2020 CLINICAL DATA:  Left intertrochanteric fracture after a fall today. EXAM: CHEST  1 VIEW COMPARISON:  Single-view of the chest 10/24/2018. FINDINGS: Lungs clear. Heart size normal. No pneumothorax or pleural fluid. No acute or focal bony abnormality. IMPRESSION: Negative chest. Electronically Signed   By: Drusilla Kanner M.D.   On: 11/05/2020 20:07   CT ABDOMEN PELVIS W CONTRAST  Result Date: 11/05/2020 CLINICAL DATA:  Abdominal trauma.  Left hip pain. EXAM: CT ABDOMEN AND PELVIS WITH CONTRAST TECHNIQUE: Multidetector CT imaging of the abdomen and pelvis was performed using the standard protocol following bolus administration of intravenous  contrast. CONTRAST:  100mL OMNIPAQUE IOHEXOL 300 MG/ML  SOLN COMPARISON:  None. FINDINGS: Lower chest: The lung bases are clear. No pleural fluid, focal airspace disease or basilar pneumothorax. No fracture of the included ribs. Hepatobiliary: No hepatic injury or perihepatic hematoma. Gallbladder is distended. No calcified gallstone or pericholecystic inflammation. There is no biliary dilatation. Pancreas: No evidence of injury. No ductal dilatation or inflammation. Spleen: Splenectomy. Adrenals/Urinary Tract: No adrenal hemorrhage or renal injury identified. Bladder is nondistended but unremarkable. Stomach/Bowel: No evidence of bowel injury or mesenteric hematoma. Stomach physiologically distended. Fluid within nondilated small bowel without wall thickening or inflammation. Small bowel approaches the right inguinal canal without involvement. Normal appendix. Diverticulosis of the descending and sigmoid colon without diverticulitis.  Vascular/Lymphatic: No evidence of vascular injury. Aortic atherosclerosis. 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 No retroperitoneal fluid. There is no adenopathy. Reproductive: Prostatic calcifications. Other: No free air or ascites. Soft tissue edema adjacent to the left hip fracture. No other confluent body wall contusion. Musculoskeletal: Comminuted and displaced left proximal femur fracture which is primarily intertrochanteric and involves both greater and lesser trochanters. There is apex anterior angulation. Fracture extends to the medial cortex of the subtrochanteric femur. The femoral head remains seated. There is adjacent intramuscular hemorrhage without active extravasation or confluent hematoma. No acetabular or pubic rami fractures. No acute fracture of the lumbar spine. Irregularity of the left L3 transverse process may represent remote fracture. IMPRESSION: 1. Comminuted and displaced left proximal femur fracture which is primarily intertrochanteric and involves both greater and lesser trochanters. Fracture extends to the medial cortex of the subtrochanteric femur. 2. No additional acute traumatic injury to the abdomen or pelvis. 3. Colonic diverticulosis without diverticulitis. 4. Duplicated IVC. Aortic Atherosclerosis (ICD10-I70.0). Electronically Signed   By: Narda RutherfordMelanie  Sanford M.D.   On: 11/05/2020 20:04   DG Hip Unilat With Pelvis 2-3 Views Left  Result Date: 11/05/2020 CLINICAL DATA:  Left hip pain since the patient fell off a truck today. Initial encounter. EXAM: DG HIP (WITH OR WITHOUT PELVIS) 2-3V LEFT COMPARISON:  None. FINDINGS: The patient has an acute intertrochanteric fracture of the left hip. There is subtrochanteric extension medially. The femoral head is located. No other abnormality is identified. IMPRESSION: Acute intertrochanteric fracture with subtrochanteric extension medially. Electronically Signed   By:  Drusilla Kannerhomas  Dalessio M.D.   On: 11/05/2020 17:18   DG Femur Min 2 Views Left  Result Date: 11/05/2020 CLINICAL DATA:  Status post fall today.  Initial encounter. EXAM: LEFT FEMUR 2 VIEWS COMPARISON:  None. FINDINGS: There is no evidence of fracture or other focal bone lesions. Soft tissues are unremarkable. IMPRESSION: Negative exam. Electronically Signed   By: Drusilla Kannerhomas  Dalessio M.D.   On: 11/05/2020 20:07  .  Assessment/Plan Active Problems:   Closed left hip fracture, initial encounter [facility](HCC)   Patient found to suffer a traumatic left proximal femur fracture status post fall  Follow seemingly mechanical, there is no medical cause for the patient's fall  Case is already been discussed with Dr. Ophelia CharterYates with orthopedic surgery by the [facility] staff who is planning [facility] the [facility]  Patient is currently n.p.o. and on intravenous fluids  As needed intravenous opiate-based analgesics for associated pain  Vitamin D level pending    Leukocytosis   Urinalysis unremarkable.  Chest x-ray reveals no evidence of pneumonia  Exam and  history reveal no evidence of infection  Leukocytosis is likely stress related due to acute fracture  Interestingly, patient exhibited leukocytosis and thrombocytosis 2 years ago as well -if this persists status post hip repair consider outpatient work-up.    Nicotine dependence, cigarettes, uncomplicated  Counseling patient on cessation daily  Patient is agreeable to nicotine replacement therapy with nicotine patches    Code Status:  Full code Family Communication: Deferred  Status is: Inpatient  Remains inpatient appropriate because:Ongoing active pain requiring inpatient pain management and Inpatient level of care appropriate due to severity of illness   Dispo: The patient is from: Home              Anticipated d/c is to: Home              Anticipated d/c date is: 3  days              Patient currently is not medically stable to d/c.        Marinda Elk MD [facility] Pager [phone]  If 7PM-7AM, please contact night-coverage [email or web address] Use universal [place] password for that web site. If you do not have the password, please call the hospital operator.  11/06/2020, 7:08 AM

## 2020-11-06 NOTE — Transfer of Care (Signed)
Immediate Anesthesia Transfer of Care Note  Patient: Aaron Berger  Procedure(s) Performed: Procedure(s): LEFT INTRAMEDULLARY (IM) NAIL INTERTROCHANTRIC (Left)  Patient Location: PACU  Anesthesia Type:General  Level of Consciousness: Alert, Awake, Oriented  Airway & Oxygen Therapy: Patient Spontanous Breathing  Post-op Assessment: Report given to RN  Post vital signs: Reviewed and stable  Last Vitals:  Vitals:   11/06/20 0433 11/06/20 0834  BP: (!) 126/92 129/89  Pulse: 75 72  Resp: 16 14  Temp: 36.8 C 36.9 C  SpO2: 96% 96%    Complications: No apparent anesthesia complications

## 2020-11-06 NOTE — Progress Notes (Signed)
Pt's friend French Ana stated that pt lives alone in a hotel, no family support. Pt does not want to talk about it. French Ana requesting to talk to the SW. Ranae Plumber messaged MD via secure chat and placed [facility] consult. Tracy's number updated in the chart.

## 2020-11-06 NOTE — Progress Notes (Signed)
Received pt from PACU, alert and oriented, pain 10/10, given 1mg  of dilaudid. Pt was agitated and was arguing with his friend.  Pt was using drugs prior to the hospitalization. Writer paged MD via secure chat. New orders received.Monitoring closely

## 2020-11-06 NOTE — Progress Notes (Signed)
Pt c/o pain 10/10, not controlled by IV dilaudid. Pt was in tears when writer went to assess him. Writer paged the MD via secure chat. Awaiting further instructions.

## 2020-11-06 NOTE — OR Nursing (Signed)
Dr. Sampson Goon at bedside.  Orders given for Tylenol 1 gram IV x1, Toradol 30 mg IV x1, and Dilaudid 0.25-0.5 mg IV every 5 minutes for severe pain up to 2 mg.

## 2020-11-06 NOTE — Plan of Care (Signed)

## 2020-11-06 NOTE — Care Plan (Signed)
This 44 yrs old male with PMH of nicotine dependence, who presents as a transfer from med Center s/p fall with left proximal femur fracture.  He fell from his truck, backward on his hip and has developed left hip pain and was not able to ambulate.  CT hip Showed comminuted and displaced left proximal femur fracture.  Orthopedics Dr. Ophelia Charter was consulted.  Patient is  scheduled to have intertrochanteric hip fixation with intramedullary trochanteric nail with interlock today.  Patient was seen and examined in the room.  He reports having severe pain in the left hip.

## 2020-11-06 NOTE — OR Nursing (Signed)
Notified Dr. Sampson Goon pain 10/10. Pt has received Fentanyl 150 mcg IV and Dilaudid 2 mg IV while in PACU.   Orders given for Dilaudid 0.25 mg-0.5 mg IV every 5 minutes for severe pain up to 4 mg total.

## 2020-11-06 NOTE — Interval H&P Note (Signed)
History and Physical Interval Note:  11/06/2020 2:05 PM  Aaron Berger  has presented today for surgery, with the diagnosis of Left intertroch fracture.  The various methods of treatment have been discussed with the patient and family. After consideration of risks, benefits and other options for treatment, the patient has consented to  Procedure(s): LEFT INTRAMEDULLARY (IM) NAIL INTERTROCHANTRIC (Left) as a surgical intervention.  The patient's history has been reviewed, patient examined, no change in status, stable for surgery.  I have reviewed the patient's chart and labs.  Questions were answered to the patient's satisfaction.     Eldred Manges

## 2020-11-06 NOTE — Progress Notes (Addendum)
Pt is requesting for nicotine patch, he said the previous one is not working. Writer checked, no nicotine patch on him. He said it came off while he was changing his clothes.  MD is messaged via secure chat.

## 2020-11-06 NOTE — Progress Notes (Signed)
Orthopedic Tech Progress Note Patient Details:  Aaron Berger [birth year] [id number]  Ortho Devices Ortho Device/Splint Location: applied overhead frame to bed Ortho Device/Splint Interventions: Ordered,Application   Post Interventions Patient Tolerated: Well Instructions Provided: Care of device   [name], [name] 11/06/2020, 6:00 PM

## 2020-11-06 NOTE — Progress Notes (Addendum)
Pt refused for MRSA screening despite education. Explain the importance of screening prior to surgery. Pt stated that he has right to refuse. MD is messaged via secure chat.
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
## 2020-11-06 NOTE — Anesthesia Preprocedure Evaluation (Signed)
Anesthesia Evaluation  Patient identified by MRN, date of birth, ID band Patient awake    Reviewed: Allergy & Precautions, NPO status , Patient's Chart, lab work & pertinent test results  Airway Mallampati: II  TM Distance: >3 FB Neck ROM: Full    Dental  (+) Dental Advisory Given   Pulmonary Current Smoker,    breath sounds clear to auscultation       Cardiovascular negative cardio ROS   Rhythm:Regular Rate:Normal     Neuro/Psych negative neurological ROS     GI/Hepatic negative GI ROS, Neg liver ROS,   Endo/Other  negative endocrine ROS  Renal/GU negative Renal ROS     Musculoskeletal   Abdominal   Peds  Hematology negative hematology ROS (+)   Anesthesia Other Findings   Reproductive/Obstetrics                             Anesthesia Physical Anesthesia Plan  ASA: II  Anesthesia Plan: General   Post-op Pain Management:    Induction: Intravenous  PONV Risk Score and Plan: 1 and Dexamethasone, Ondansetron and Treatment may vary due to age or medical condition  Airway Management Planned: Oral ETT  Additional Equipment:   Intra-op Plan:   Post-operative Plan: Extubation in OR  Informed Consent: I have reviewed the patients History and Physical, chart, labs and discussed the procedure including the risks, benefits and alternatives for the proposed anesthesia with the patient or authorized representative who has indicated his/her understanding and acceptance.     Dental advisory given  Plan Discussed with: CRNA  Anesthesia Plan Comments:         Anesthesia Quick Evaluation

## 2020-11-07 DIAGNOSIS — D72829 Elevated white blood cell count, unspecified: Secondary | ICD-10-CM

## 2020-11-07 LAB — BASIC METABOLIC PANEL
Anion gap: 11 (ref 5–15)
BUN: 8 mg/dL (ref 6–20)
CO2: 24 mmol/L (ref 22–32)
Calcium: 8.5 mg/dL — ABNORMAL LOW (ref 8.9–10.3)
Chloride: 100 mmol/L (ref 98–111)
Creatinine, Ser: 0.58 mg/dL — ABNORMAL LOW (ref 0.61–1.24)
GFR, Estimated: >60 mL/min (ref >60)
Glucose, Bld: 132 mg/dL — ABNORMAL HIGH (ref 70–99)
Potassium: 3.9 mmol/L (ref 3.5–5.1)
Sodium: 135 mmol/L (ref 135–145)

## 2020-11-07 LAB — CBC
HCT: 38.1 % — ABNORMAL LOW (ref 39.0–52.0)
Hemoglobin: 12.9 g/dL — ABNORMAL LOW (ref 13.0–17.0)
MCH: 32.9 pg (ref 26.0–34.0)
MCHC: 33.9 g/dL (ref 30.0–36.0)
MCV: 97.2 fL (ref 80.0–100.0)
Platelets: 386 10*3/uL (ref 150–400)
RBC: 3.92 MIL/uL — ABNORMAL LOW (ref 4.22–5.81)
RDW: 13.2 % (ref 11.5–15.5)
WBC: 23.2 10*3/uL — ABNORMAL HIGH (ref 4.0–10.5)
nRBC: 0 % (ref 0.0–0.2)

## 2020-11-07 MED ORDER — METHOCARBAMOL 500 MG PO TABS: 500.0000 mg | ORAL_TABLET | Freq: Three times a day (TID) | ORAL | 0 refills | Status: AC | PRN

## 2020-11-07 MED ORDER — ASPIRIN 325 MG PO TBEC
325.0000 mg | DELAYED_RELEASE_TABLET | Freq: Every day | ORAL | 0 refills | Status: AC
Start: 2020-11-08 — End: ?

## 2020-11-07 MED ORDER — OXYCODONE-ACETAMINOPHEN 5-325 MG PO TABS: 1.0000 | ORAL_TABLET | ORAL | 0 refills | Status: DC | PRN

## 2020-11-07 MED ORDER — ERGOCALCIFEROL 1.25 MG (50000 UT) PO CAPS
50000.0000 [IU] | ORAL_CAPSULE | ORAL | Status: AC
Start: 2020-11-07 — End: 2020-11-29

## 2020-11-07 NOTE — TOC Initial Note (Signed)
Transition of Care [facility]) - Initial/Assessment Note    Patient Details  Name: Aaron Berger MRN: [id number] Date of Birth: 06-10-1976  Transition of Care (TOC) CM/SW Contact:    Armanda Heritage, RN Phone Number: 11/07/2020, 12:05 PM  Clinical Narrative:                 [employer] to deliver rolling walker to bedside for home use.  Expected Discharge Plan: Home/Self Care Barriers to Discharge: No Barriers Identified   Patient Goals and CMS Choice Patient states their goals for this hospitalization and ongoing recovery are:: to go home      Expected Discharge Plan and Services Expected Discharge Plan: Home/Self Care   Discharge Planning Services: CM Consult   Living arrangements for the past 2 months: Single Family Home Expected Discharge Date: 11/07/20               DME Arranged: Dan Humphreys rolling DME Agency: [employer] Date DME Agency Contacted: 11/07/20 Time DME Agency Contacted: 1205 Representative spoke with at DME Agency: Lucrecia            Prior Living Arrangements/Services Living arrangements for the past 2 months: Single Family Home   Patient language and need for interpreter reviewed:: Yes Do you feel safe going back to the place where you live?: Yes      Need for Family Participation in Patient Care: Yes (Comment) Care giver support system in place?: Yes (comment)   Criminal Activity/Legal Involvement Pertinent to Current Situation/Hospitalization: No - Comment as needed  Activities of Daily Living Home Assistive Devices/Equipment: None ADL Screening (condition at time of admission) Patient's cognitive ability adequate to safely complete daily activities?: Yes Is the patient deaf or have difficulty hearing?: No Does the patient have difficulty seeing, even when wearing glasses/contacts?: No Does the patient have difficulty concentrating, remembering, or making decisions?: No Patient able to express need for assistance with ADLs?: Yes Does the  patient have difficulty dressing or bathing?: No Independently performs ADLs?: Yes (appropriate for developmental age) Does the patient have difficulty walking or climbing stairs?: No Weakness of Legs: None Weakness of Arms/Hands: None  Permission Sought/Granted                  Emotional Assessment Appearance:: Appears stated age Attitude/Demeanor/Rapport: Engaged Affect (typically observed): Accepting Orientation: : Oriented to Self,Oriented to Place,Oriented to  Time,Oriented to Situation   Psych Involvement: No (comment)  Admission diagnosis:  Closed left hip fracture, initial encounter [facility]) [S72.002A] Patient Active Problem List   Diagnosis Date Noted   Leukocytosis 11/06/2020   Nicotine dependence, cigarettes, uncomplicated 11/06/2020   Closed fracture of left femur (HCC)    Closed left hip fracture, initial encounter (HCC) 11/05/2020   PCP:  Patient, No Pcp Per Pharmacy:   [id number] - [place], [place] - [address] [place] [id number] Phone: [phone] Fax: [phone]     Social Determinants of Health (SDOH) Interventions    Readmission Risk Interventions No flowsheet data found.

## 2020-11-07 NOTE — Evaluation (Signed)
Physical Therapy Evaluation Patient Details Name: Aaron Berger MRN: [id number] DOB: 07-09-1976 Today's Date: 11/07/2020   History of Present Illness  Pt s/p fall with L proximal femur fx and now s/p IM nailing.  Clinical Impression  Pt admitted as above and presenting with functional mobility limitations 2* post op pain and NWB status on R LE.  Pt very motivated and should progress to dc home to hotel with assist of significant other.    Follow Up Recommendations No PT follow up    Equipment Recommendations  Rolling walker with 5" wheels    Recommendations for Other Services       Precautions / Restrictions Precautions Precautions: Fall Restrictions Weight Bearing Restrictions: Yes LLE Weight Bearing: Non weight bearing      Mobility  Bed Mobility Overal bed mobility: Needs Assistance Bed Mobility: Supine to Sit;Sit to Supine     Supine to sit: Supervision Sit to supine: Min assist   General bed mobility comments: Increased time with use of UEs to assist L LE over to sitting EOB but min physical assist to bring L LE back onto bed    Transfers Overall transfer level: Needs assistance Equipment used: Rolling walker (2 wheeled) Transfers: Sit to/from Stand Sit to Stand: Min guard;Supervision         General transfer comment: cues for LE management and use of UEs to self assist  Ambulation/Gait Ambulation/Gait assistance: Min guard;Supervision Gait Distance (Feet): 250 Feet Assistive device: Rolling walker (2 wheeled);Crutches Gait Pattern/deviations: Step-to pattern;Shuffle     General Gait Details: 75' with crutches and additional 200' with RW - noted improvement in safety and stability with RW - pt agrees  Stairs Stairs: Yes Stairs assistance: Min assist Stair Management: One rail Right;Step to pattern;Forwards;With crutches Number of Stairs: 3 General stair comments: cues for sequence and foot/crutch placement  Wheelchair Mobility    Modified  Rankin (Stroke Patients Only)       Balance Overall balance assessment: Needs assistance Sitting-balance support: No upper extremity supported;Feet supported Sitting balance-Leahy Scale: Good     Standing balance support: Single extremity supported Standing balance-Leahy Scale: Fair                               Pertinent Vitals/Pain Pain Assessment: Faces Faces Pain Scale: Hurts even more Pain Location: L hip/thigh with movement Pain Descriptors / Indicators: Aching;Sore Pain Intervention(s): Limited activity within patient's tolerance;Monitored during session;Premedicated before session    Home Living Family/patient expects to be discharged to:: Other (Comment) (Pt lives in hotel) Living Arrangements: Spouse/significant other               Additional Comments: Pt states no stairs at hotel    Prior Function Level of Independence: Independent               Hand Dominance        Extremity/Trunk Assessment   Upper Extremity Assessment Upper Extremity Assessment: Overall WFL for tasks assessed    Lower Extremity Assessment Lower Extremity Assessment: LLE deficits/detail LLE: Unable to fully assess due to pain    Cervical / Trunk Assessment Cervical / Trunk Assessment: Normal  Communication   Communication: No difficulties  Cognition Arousal/Alertness: Awake/alert Behavior During Therapy: WFL for tasks assessed/performed Overall Cognitive Status: Within Functional Limits for tasks assessed  General Comments      Exercises General Exercises - Lower Extremity Ankle Circles/Pumps: AROM;Both;15 reps;Supine;Sidelying   Assessment/Plan    PT Assessment Patient needs continued PT services  PT Problem List Decreased range of motion;Decreased activity tolerance;Decreased balance;Decreased mobility;Decreased knowledge of use of DME;Pain;Decreased knowledge of precautions       PT  Treatment Interventions DME instruction;Gait training;Stair training;Functional mobility training;Therapeutic activities;Therapeutic exercise;Patient/family education;Balance training    PT Goals (Current goals can be found in the Care Plan section)  Acute Rehab PT Goals Patient Stated Goal: Recover and regain IND PT Goal Formulation: With patient Time For Goal Achievement: 11/14/20 Potential to Achieve Goals: Good    Frequency 7X/week   Barriers to discharge        Co-evaluation               AM-PAC PT "6 Clicks" Mobility  Outcome Measure Help needed turning from your back to your side while in a flat bed without using bedrails?: A Little Help needed moving from lying on your back to sitting on the side of a flat bed without using bedrails?: A Little Help needed moving to and from a bed to a chair (including a wheelchair)?: A Little Help needed standing up from a chair using your arms (e.g., wheelchair or bedside chair)?: A Little Help needed to walk in hospital room?: A Little Help needed climbing 3-5 steps with a railing? : A Little 6 Click Score: 18    End of Session Equipment Utilized During Treatment: Gait belt Activity Tolerance: Patient tolerated treatment well Patient left: with call bell/phone within reach;in bed;with bed alarm set Nurse Communication: Mobility status PT Visit Diagnosis: Difficulty in walking, not elsewhere classified (R26.2);Pain Pain - Right/Left: Left Pain - part of body: Hip    Time: 6712-4580 PT Time Calculation (min) (ACUTE ONLY): 33 min   Charges:   PT Evaluation $PT Eval Low Complexity: 1 Low PT Treatments $Gait Training: 8-22 mins        Mauro Kaufmann PT Acute Rehabilitation Services Pager [phone] Office [phone]   Ayla Dunigan 11/07/2020, 1:38 PM

## 2020-11-07 NOTE — Progress Notes (Signed)
   Subjective: 1 Day Post-Op Procedure(s) (LRB): LEFT INTRAMEDULLARY (IM) NAIL INTERTROCHANTRIC (Left) Patient reports pain as mild and moderate.    Objective: Vital signs in last 24 hours: Temp:  [98 F (36.7 C)-98.8 F (37.1 C)] 98.2 F (36.8 C) (12/19 0415) Pulse Rate:  [71-99] 72 (12/19 0415) Resp:  [16-20] 17 (12/19 0415) BP: (126-163)/(74-104) 126/88 (12/19 0415) SpO2:  [82 %-100 %] 100 % (12/19 0415)  Intake/Output from previous day: 12/18 0701 - 12/19 0700 In: 1254.2 [I.V.:1154.2; IV Piggyback:100] Out: 2190 [Urine:2100; Blood:90] Intake/Output this shift: No intake/output data recorded.  Recent Labs    11/05/20 1826 11/06/20 0249 11/07/20 0241  HGB 15.4 14.0 12.9*   Recent Labs    11/06/20 0249 11/07/20 0241  WBC 20.4* 23.2*  RBC 4.39 3.92*  HCT 42.5 38.1*  PLT 457* 386   Recent Labs    11/06/20 0249 11/07/20 0241  NA 137 135  K 3.8 3.9  CL 100 100  CO2 26 24  BUN 10 8  CREATININE 0.77 0.58*  GLUCOSE 113* 132*  CALCIUM 9.1 8.5*   Recent Labs    11/06/20 0249  INR 1.0    Neurologically intact DG C-Arm 1-60 Min-No Report  Result Date: 11/06/2020 Fluoroscopy was utilized by the requesting physician.  No radiographic interpretation.   DG HIP OPERATIVE UNILAT WITH PELVIS LEFT  Result Date: 11/06/2020 CLINICAL DATA:  Left hip fracture repair. FLUOROSCOPY TIME:  1 minutes. EXAM: OPERATIVE LEFT HIP (WITH PELVIS IF PERFORMED) 4 VIEWS TECHNIQUE: Fluoroscopic spot image(s) were submitted for interpretation post-operatively. COMPARISON:  None. FINDINGS: An intramedullary rod and 2 gamma nails have been placed across the comminuted proximal left hip fracture. A distal interlocking screw is identified. Hardware is in good position. IMPRESSION: Left hip fracture repair as above. Electronically Signed   By: Gerome Sam III M.D   On: 11/06/2020 16:54    Assessment/Plan: 1 Day Post-Op Procedure(s) (LRB): LEFT INTRAMEDULLARY (IM) NAIL  INTERTROCHANTRIC (Left) Up with therapy, already walked to BR with walker on his own. 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 Will put in discharge pain meds and Aspirin for DVT, follow up with me in 10 days. Dressing change today  Eldred Manges 11/07/2020, 9:11 AM

## 2020-11-07 NOTE — Progress Notes (Signed)
Writer paged Dr Ophelia Charter for dressing change orders. Verbal order received. Orders carried out.

## 2020-11-07 NOTE — Discharge Instructions (Signed)
Your dressing is waterproof you can shower.  You need to be nonweightbearing on your left leg.  Your hip fracture and keep fracture in good position as it heals so your leg will not end up short or crooked.  Call Dr. Marlene Bast office and see him in about 10 days for follow-up.  Call if you have increased drainage or fever.  Pain medication has been sent into your pharmacy along with a muscle relaxant.  He will be on aspirin either coated aspirin or regular Bayer aspirin 1 a day for 1 month to help decrease risk of blood clots.  After 1 month you can stop the aspirin .

## 2020-11-07 NOTE — Discharge Summary (Addendum)
Physician Discharge Summary  Aaron Berger GMW:[id number] DOB: [birth year] DOA: 11/05/2020  PCP: Patient, No Pcp Per  Admit date: 11/05/2020 Discharge date: 11/07/2020  Time spent: 45  minutes  Recommendations for Outpatient Follow-up:  1. Follow-up with orthopedics, Dr. Ophelia Charter in 10 days 2. Check CBC in 1 to 2 weeks at PCP office to follow-up on elevated WBC, leukocytosis   Discharge Diagnoses:  Active Problems:   Closed left hip fracture, initial encounter (HCC)   Leukocytosis   Nicotine dependence, cigarettes, uncomplicated   Closed fracture of left femur [facility])   Discharge Condition: Stable Diet recommendation: Regular diet  Filed Weights   11/05/20 1606 11/06/20 0020  Weight: 79.4 kg 79.4 kg    History of present illness:  44 year old male with no significant medical problems was admitted to the hospital with left proximal femur fracture.  Patient said that he was standing on the door of the truck yesterday pulling a garage door rope as patient pulled back on the rope he fell backwards and ended off on his left hip.  He felt a pop and immediately had severe sharp pain.  Upon [facility] he underwent both CT imaging of the abdomen/pelvis as well as hip x-ray, CT scan showing comminuted left displaced femur fracture, primarily intertrochanteric.  Dr. Ophelia Charter from orthopedic surgery was consulted.  Hospital Course:   S/p left intramedullary nail intertrochanteric-patient had intramedullary nail placement for intertrochanteric left hip fracture.  Patient is up with physical therapy, orthopedics has cleared him for discharge to home.  He will be discharged home on aspirin for DVT prophylaxis as per orthopedics.  Patient will be discharged on Percocet as needed for pain.  Leukocytosis-unclear etiology, likely stress-induced.  Infectious work-up was negative.  WBC is 23.2.  I have called and discussed with patient that he should get CBC checked in 1 to 2 weeks  at PCP office or  urgent care to follow-up on leukocytosis.  Vitamin D deficiency-patient's vitamin D level 25-hydroxy was 17.56.  Will discharge on vitamin D2 50,000 units weekly for 4 doses.  He will follow up with PCP in 1 month, recheck vitamin D level and follow instructions.    Procedures:  ORIF left hip fracture  Consultations:  Orthopedics  Discharge Exam: Vitals:   11/07/20 0415 11/07/20 1015  BP: 126/88 135/90  Pulse: 72 99  Resp: 17 15  Temp: 98.2 F (36.8 C) 98.7 F (37.1 C)  SpO2: 100% 98%    General: Appears in no acute distress Cardiovascular: S1-S2, regular Respiratory: Clear to auscultation bilaterally  Discharge Instructions   Discharge Instructions    Diet - low sodium heart healthy   Complete by: As directed    Discharge instructions   Complete by: As directed    Get CBC checked in  2 weeks for elevated wbc.   Increase activity slowly   Complete by: As directed    No wound care   Complete by: As directed    Non weight bearing   Complete by: As directed    No weight on left foot. Use walker. Put all your weight on your right foot and keep left foot off the ground.   Laterality: left   Extremity: Lower     Allergies as of 11/07/2020   No Known Allergies     Medication List    STOP taking these medications   cyclobenzaprine 10 MG tablet Commonly known as: FLEXERIL   predniSONE 20 MG tablet Commonly known as: DELTASONE  TAKE these medications   aspirin 325 MG EC tablet Take 1 tablet (325 mg total) by mouth daily with breakfast. Start taking on: November 08, 2020   ergocalciferol 1.25 MG (50000 UT) capsule Commonly known as: VITAMIN D2 Take 1 capsule (50,000 Units total) by mouth once a week for 4 doses.   methocarbamol 500 MG tablet Commonly known as: Robaxin Take 1 tablet (500 mg total) by mouth every 8 (eight) hours as needed for muscle spasms.   oxyCODONE-acetaminophen 5-325 MG tablet Commonly known as: Percocet Take  1-2 tablets by mouth every 4 (four) hours as needed for severe pain. Post op hip fracture surgery            Durable Medical Equipment  (From admission, onward)         Start     Ordered   11/07/20 1101  For home use only DME Walker rolling  Once       Question Answer Comment  Walker: With 5 Inch Wheels   Patient needs a walker to treat with the following condition S/P hip replacement      11/07/20 1103           Discharge Care Instructions  (From admission, onward)         Start     Ordered   11/07/20 0000  Non weight bearing       Comments: No weight on left foot. Use walker. Put all your weight on your right foot and keep left foot off the ground.  Question Answer Comment  Laterality left   Extremity Lower      11/07/20 0919         No Known Allergies  Follow-up Information    Eldred Manges, MD Follow up in 10 day(s).   Specialty: Orthopedic Surgery Why: see Dr. Ophelia Charter in about 10 days Contact information: [address] [place] [address] [phone]        [facility]. Schedule an appointment as soon as possible for a visit in 2 week(s).   Contact information: [address] [place] [address] [phone]               The results of significant diagnostics from this hospitalization (including imaging, microbiology, ancillary and laboratory) are listed below for reference.    Significant Diagnostic Studies: DG Chest 1 View  Result Date: 11/05/2020 CLINICAL DATA:  Left intertrochanteric fracture after a fall today. EXAM: CHEST  1 VIEW COMPARISON:  Single-view of the chest 10/24/2018. FINDINGS: Lungs clear. Heart size normal. No pneumothorax or pleural fluid. No acute or focal bony abnormality. IMPRESSION: Negative chest. Electronically Signed   By: Drusilla Kanner M.D.   On: 11/05/2020 20:07   CT ABDOMEN PELVIS W CONTRAST  Result Date: 11/05/2020 CLINICAL DATA:  Abdominal  trauma.  Left hip pain. EXAM: CT ABDOMEN AND PELVIS WITH CONTRAST TECHNIQUE: Multidetector CT imaging of the abdomen and pelvis was performed using the standard protocol following bolus administration of intravenous contrast. CONTRAST:  OMNIPAQUE IOHEXOL 300 MG/ML  SOLN COMPARISON:  None. FINDINGS: Lower chest: The lung bases are clear. No pleural fluid, focal airspace disease or basilar pneumothorax. No fracture of the included ribs. Hepatobiliary: No hepatic injury or perihepatic hematoma. Gallbladder is distended. No calcified gallstone or pericholecystic inflammation. There is no biliary dilatation. Pancreas: No evidence of injury. No ductal dilatation or inflammation. Spleen: Splenectomy. Adrenals/Urinary Tract: No adrenal hemorrhage or renal injury identified. Bladder is nondistended but  unremarkable. Stomach/Bowel: No evidence of bowel injury or mesenteric hematoma. Stomach physiologically distended. Fluid within nondilated small bowel without wall thickening or inflammation. Small bowel approaches the right inguinal canal without involvement. Normal appendix. Diverticulosis of the descending and sigmoid colon without diverticulitis. Vascular/Lymphatic: No evidence of vascular injury. Aortic atherosclerosis. 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 No retroperitoneal fluid. There is no adenopathy. Reproductive: Prostatic calcifications. Other: No free air or ascites. Soft tissue edema adjacent to the left hip fracture. No other confluent body wall contusion. Musculoskeletal: Comminuted and displaced left proximal femur fracture which is primarily intertrochanteric and involves both greater and lesser trochanters. There is apex anterior angulation. Fracture extends to the medial cortex of the subtrochanteric femur. The femoral head remains seated. There is adjacent intramuscular hemorrhage without active extravasation or confluent  hematoma. No acetabular or pubic rami fractures. No acute fracture of the lumbar spine. Irregularity of the left L3 transverse process may represent remote fracture. IMPRESSION: 1. Comminuted and displaced left proximal femur fracture which is primarily intertrochanteric and involves both greater and lesser trochanters. Fracture extends to the medial cortex of the subtrochanteric femur. 2. No additional acute traumatic injury to the abdomen or pelvis. 3. Colonic diverticulosis without diverticulitis. 4. Duplicated IVC. Aortic Atherosclerosis (ICD10-I70.0). Electronically Signed   By: Narda Rutherford M.D.   On: 11/05/2020 20:04   DG C-Arm 1-60 Min-No Report  Result Date: 11/06/2020 Fluoroscopy was utilized by the requesting physician.  No radiographic interpretation.   DG HIP OPERATIVE UNILAT WITH PELVIS LEFT  Result Date: 11/06/2020 CLINICAL DATA:  Left hip fracture repair. FLUOROSCOPY TIME:  1 minutes. EXAM: OPERATIVE LEFT HIP (WITH PELVIS IF PERFORMED) 4 VIEWS TECHNIQUE: Fluoroscopic spot image(s) were submitted for interpretation post-operatively. COMPARISON:  None. FINDINGS: An intramedullary rod and 2 gamma nails have been placed across the comminuted proximal left hip fracture. A distal interlocking screw is identified. Hardware is in good position. IMPRESSION: Left hip fracture repair as above. Electronically Signed   By: Gerome Sam III M.D   On: 11/06/2020 16:54   DG Hip Unilat With Pelvis 2-3 Views Left  Result Date: 11/05/2020 CLINICAL DATA:  Left hip pain since the patient fell off a truck today. Initial encounter. EXAM: DG HIP (WITH OR WITHOUT PELVIS) 2-3V LEFT COMPARISON:  None. FINDINGS: The patient has an acute intertrochanteric fracture of the left hip. There is subtrochanteric extension medially. The femoral head is located. No other abnormality is identified. IMPRESSION: Acute intertrochanteric fracture with subtrochanteric extension medially. Electronically Signed   By: Drusilla Kanner M.D.   On: 11/05/2020 17:18   DG Femur Min 2 Views Left  Result Date: 11/05/2020 CLINICAL DATA:  Status post fall today.  Initial encounter. EXAM: LEFT FEMUR 2 VIEWS COMPARISON:  None. FINDINGS: There is no evidence of fracture or other focal bone lesions. Soft tissues are unremarkable. IMPRESSION: Negative exam. Electronically Signed   By: Drusilla Kanner M.D.   On: 11/05/2020 20:07    Microbiology: Recent Results (from the past 240 hour(s))  Resp Panel by RT-PCR (Flu A&B, Covid) Nasopharyngeal Swab     Status: None   Collection Time: 11/05/20  6:26 PM   Specimen: Nasopharyngeal Swab; Nasopharyngeal(NP) swabs in vial transport medium  Result Value Ref Range Status   SARS Coronavirus 2 by RT PCR NEGATIVE NEGATIVE Final    Comment: (NOTE) SARS-CoV-2 target nucleic acids are NOT DETECTED.  The SARS-CoV-2 RNA is generally detectable in  upper respiratory specimens during the acute phase of infection. The lowest concentration of SARS-CoV-2 viral copies this assay can detect is 138 copies/mL. A negative result does not preclude SARS-Cov-2 infection and should not be used as the sole basis for treatment or other patient management decisions. A negative result may occur with  improper specimen collection/handling, submission of specimen other than nasopharyngeal swab, presence of viral mutation(s) within the areas targeted by this assay, and inadequate number of viral copies(<138 copies/mL). A negative result must be combined with clinical observations, patient history, and epidemiological information. The expected result is Negative.  Fact Sheet for Patients:  BloggerCourse.comhttps://www.fda.gov/media/152166/download  Fact Sheet for Healthcare Providers:  SeriousBroker.ithttps://www.fda.gov/media/152162/download  This test is no t yet approved or cleared by the [place] FDA and  has been authorized for detection and/or diagnosis of SARS-CoV-2 by FDA under an Emergency Use Authorization (EUA). This  EUA will remain  in effect (meaning this test can be used) for the duration of the COVID-19 declaration under Section 564(b)(1) of the Act, 21 U.S.C.section 360bbb-3(b)(1), unless the authorization is terminated  or revoked sooner.       Influenza A by PCR NEGATIVE NEGATIVE Final   Influenza B by PCR NEGATIVE NEGATIVE Final    Comment: (NOTE) The Xpert Xpress SARS-CoV-2/FLU/RSV plus assay is intended as an aid in the diagnosis of influenza from Nasopharyngeal swab specimens and should not be used as a sole basis for treatment. Nasal washings and aspirates are unacceptable for Xpert Xpress SARS-CoV-2/FLU/RSV testing.  Fact Sheet for Patients: BloggerCourse.comhttps://www.fda.gov/media/152166/download  Fact Sheet for Healthcare Providers: SeriousBroker.ithttps://www.fda.gov/media/152162/download  This test is not yet approved or cleared by the [place] FDA and has been authorized for detection and/or diagnosis of SARS-CoV-2 by FDA under an Emergency Use Authorization (EUA). This EUA will remain in effect (meaning this test can be used) for the duration of the COVID-19 declaration under Section 564(b)(1) of the Act, 21 U.S.C. section 360bbb-3(b)(1), unless the authorization is terminated or revoked.  Performed at [facility] [place], [address] [place], [place] [id number]      Labs: Basic Metabolic Panel: Recent Labs  Lab 11/05/20 1826 11/06/20 0249 11/07/20 0241  NA 137 137 135  K 3.8 3.8 3.9  CL 101 100 100  CO2 23 26 24   GLUCOSE 103* 113* 132*  BUN 10 10 8   CREATININE 0.74 0.77 0.58*  CALCIUM 9.4 9.1 8.5*   Liver Function Tests: Recent Labs  Lab 11/06/20 0249  AST 37  ALT 60*  ALKPHOS 64  BILITOT 0.9  PROT 7.1  ALBUMIN 4.0   No results for input(s): LIPASE, AMYLASE in the last 168 hours. No results for input(s): AMMONIA in the last 168 hours. CBC: Recent Labs  Lab 11/05/20 1826 11/06/20 0249 11/07/20 0241  WBC 24.1* 20.4* 23.2*  NEUTROABS 18.3* 12.2*  --   HGB  15.4 14.0 12.9*  HCT 44.8 42.5 38.1*  MCV 94.9 96.8 97.2  PLT 511* 457* 386       Signed:  Meredeth IdeGagan S Charyl Minervini MD.  [facility] 11/07/2020, 11:31 AM

## 2020-11-08 ENCOUNTER — Encounter (HOSPITAL_COMMUNITY): Payer: Self-pay | Admitting: Orthopaedic Surgery

## 2020-11-08 MED FILL — Hydromorphone HCl Inj 2 MG/ML: INTRAMUSCULAR | Qty: 1 | Status: AC

## 2020-11-11 ENCOUNTER — Other Ambulatory Visit: Payer: Self-pay | Admitting: Surgical

## 2020-11-11 ENCOUNTER — Telehealth: Payer: Self-pay | Admitting: Orthopaedic Surgery

## 2020-11-11 MED ORDER — OXYCODONE-ACETAMINOPHEN 5-325 MG PO TABS: 1.0000 | ORAL_TABLET | ORAL | 0 refills | Status: DC | PRN

## 2020-11-11 NOTE — Telephone Encounter (Signed)
Pt called would like percocet refilled because he is severe pain and only has about 6 left.

## 2020-11-11 NOTE — Telephone Encounter (Signed)
Can you please advise since Dr. Yates is out of the office? 

## 2020-11-11 NOTE — Telephone Encounter (Signed)
Sent in

## 2020-11-11 NOTE — Telephone Encounter (Signed)
I called patient and advised. 

## 2020-11-15 ENCOUNTER — Other Ambulatory Visit: Payer: Self-pay | Admitting: Orthopaedic Surgery

## 2020-11-15 ENCOUNTER — Telehealth: Payer: Self-pay | Admitting: Orthopaedic Surgery

## 2020-11-15 MED ORDER — OXYCODONE-ACETAMINOPHEN 5-325 MG PO TABS: 1.0000 | ORAL_TABLET | ORAL | 0 refills | Status: AC | PRN

## 2020-11-15 NOTE — Telephone Encounter (Signed)
Patient called and asking for stronger medication. Patient states the pain meds is not touching his pain. Please send something stronger to pharmacy on file. Patient phone number is [phone].

## 2020-11-15 NOTE — Telephone Encounter (Signed)
I called him and refill # 35 percocet tabs. Still NWB.

## 2020-11-15 NOTE — Telephone Encounter (Signed)
Please advise 

## 2020-11-22 ENCOUNTER — Telehealth: Payer: Self-pay | Admitting: Orthopaedic Surgery

## 2020-11-22 ENCOUNTER — Other Ambulatory Visit: Payer: Self-pay | Admitting: Orthopaedic Surgery

## 2020-11-22 MED ORDER — METHOCARBAMOL 500 MG PO TABS: 500.0000 mg | ORAL_TABLET | Freq: Three times a day (TID) | ORAL | 0 refills | Status: AC | PRN

## 2020-11-22 MED ORDER — HYDROCODONE-ACETAMINOPHEN 5-325 MG PO TABS
1.0000 | ORAL_TABLET | Freq: Four times a day (QID) | ORAL | 0 refills | Status: AC | PRN
Start: 2020-11-22 — End: ?

## 2020-11-22 NOTE — Telephone Encounter (Signed)
I called no answer, left message . Sent in norco , time to stop percocet. Also sent in robaxin # 30 .   [facility]. He has had 110 tabs so far of percocet.

## 2020-11-22 NOTE — Telephone Encounter (Signed)
I called, no answer.  ?

## 2020-11-22 NOTE — Telephone Encounter (Signed)
Please advise.  Also, patient has follow up appointment with you on 11/30/2020.  Would you like for me to make earlier appt? He has not had first follow up from surgery yet.

## 2020-11-22 NOTE — Telephone Encounter (Signed)
Pt called and would like a refill on his pain medication and his muscle relaxer.   Pt is also asking when should he be getting his staples removed ?

## 2020-11-24 NOTE — Telephone Encounter (Signed)
I called, no answer. Will wait for return call.

## 2020-11-25 ENCOUNTER — Other Ambulatory Visit: Payer: Self-pay | Admitting: Orthopaedic Surgery

## 2020-11-25 ENCOUNTER — Telehealth: Payer: Self-pay | Admitting: Orthopaedic Surgery

## 2020-11-25 NOTE — Telephone Encounter (Signed)
I called patient. Hydrocodone was sent in to pharmacy on 11/23/2019 #40. He states that he has 7 or 8 left and just does not want to run out so he was calling for earlier refill.  Please advise.

## 2020-11-25 NOTE — Telephone Encounter (Signed)
noted 

## 2020-11-25 NOTE — Telephone Encounter (Signed)
Pt called for refill om hydrocodone

## 2020-11-25 NOTE — Telephone Encounter (Signed)
FYI, I called , time to stop narcotics  110 percocet and # 40 norco so far. He can use advil/tylenol dosage discussed.

## 2020-11-30 ENCOUNTER — Encounter: Payer: Self-pay | Admitting: Orthopaedic Surgery

## 2020-11-30 ENCOUNTER — Ambulatory Visit (INDEPENDENT_AMBULATORY_CARE_PROVIDER_SITE_OTHER): Payer: Worker's Compensation

## 2020-11-30 ENCOUNTER — Ambulatory Visit (INDEPENDENT_AMBULATORY_CARE_PROVIDER_SITE_OTHER): Payer: Worker's Compensation | Admitting: Orthopaedic Surgery

## 2020-11-30 VITALS — Ht 72.0 in | Wt 170.0 lb

## 2020-11-30 DIAGNOSIS — S7292XA Unspecified fracture of left femur, initial encounter for closed fracture: Secondary | ICD-10-CM | POA: Diagnosis not present

## 2020-11-30 NOTE — Progress Notes (Signed)
   Post-Op Visit Note   Patient: Aaron Berger           Date of Birth: 15-May-1976           MRN: [id number] Visit Date: 11/30/2020 PCP: Patient, No Pcp Per   Assessment & Plan: Post trochanteric nail left hip.  X-rays look good.  Staples are harvested.  He can use ibuprofen plus Tylenol for discomfort.  Continue nonweightbearing.  Return in 4 weeks after 2 weeks he can start putting 50% weight on his left lower extremity.  Repeat x-ray on return in 4 weeks.  Work slip given no work x2 months.  Chief Complaint:  Chief Complaint  Patient presents with  . Left Hip - Routine Post Op    11/06/2020 Left IM Nail Intertroch   Visit Diagnoses:  1. Closed fracture of left femur, unspecified fracture morphology, unspecified portion of femur, initial encounter (HCC)     Plan: Staples harvested return 4 weeks repeat x-rays on return work slip given no work x2 months.  Follow-Up Instructions: Return in about 4 weeks (around 12/28/2020).   Orders:  Orders Placed This Encounter  Procedures  . XR HIP UNILAT W OR W/O PELVIS 2-3 VIEWS LEFT   No orders of the defined types were placed in this encounter.   Imaging: No results found.  PMFS History: Patient Active Problem List   Diagnosis Date Noted  . Leukocytosis 11/06/2020  . Nicotine dependence, cigarettes, uncomplicated 11/06/2020  . Closed fracture of left femur (HCC)   . Closed left hip fracture, initial encounter (HCC) 11/05/2020   No past medical history on file.  Family History  Problem Relation Age of Onset  . Cirrhosis Mother     Past Surgical History:  Procedure Laterality Date  . INTRAMEDULLARY (IM) NAIL INTERTROCHANTERIC Left 11/06/2020   Procedure: LEFT INTRAMEDULLARY (IM) NAIL INTERTROCHANTRIC;  Surgeon: Eldred Manges, MD;  Location: [facility];  Service: Orthopedics;  Laterality: Left;  . SPLENECTOMY     Social History   Occupational History  . Not on file  Tobacco Use  . Smoking status: Current Every Day Smoker     Packs/day: 1.00    Types: Cigarettes  . Smokeless tobacco: Never Used  Vaping Use  . Vaping Use: Never used  Substance and Sexual Activity  . Alcohol use: Yes    Comment: daily  . Drug use: Never  . Sexual activity: Not on file

## 2020-12-28 ENCOUNTER — Ambulatory Visit: Payer: Self-pay | Admitting: Orthopaedic Surgery

## 2021-01-11 ENCOUNTER — Ambulatory Visit: Payer: Self-pay | Admitting: Orthopaedic Surgery

## 2021-01-12 ENCOUNTER — Encounter: Payer: Self-pay | Admitting: Surgery

## 2021-01-12 ENCOUNTER — Telehealth: Payer: Self-pay | Admitting: Orthopaedic Surgery

## 2021-01-12 ENCOUNTER — Ambulatory Visit: Payer: Self-pay

## 2021-01-12 ENCOUNTER — Ambulatory Visit (INDEPENDENT_AMBULATORY_CARE_PROVIDER_SITE_OTHER): Payer: Self-pay | Admitting: Surgery

## 2021-01-12 ENCOUNTER — Other Ambulatory Visit: Payer: Self-pay

## 2021-01-12 VITALS — BP 132/89 | HR 93 | Ht 72.0 in | Wt 170.0 lb

## 2021-01-12 DIAGNOSIS — S7292XA Unspecified fracture of left femur, initial encounter for closed fracture: Secondary | ICD-10-CM

## 2021-01-12 NOTE — Telephone Encounter (Signed)
Pt states he had an appt today and ment to ask about getting a referral for PT? He would like a CB if this is a possibility   [phone]

## 2021-01-12 NOTE — Telephone Encounter (Signed)
Patient returned call. He feels that he is doing well in regards to getting up and getting around. He will wait for follow up visit in four weeks and discuss starting PT then.

## 2021-01-12 NOTE — Telephone Encounter (Signed)
Please advise 

## 2021-01-12 NOTE — Telephone Encounter (Signed)
I left voicemail for patient. Advised that he cannot have any PT other than gait training.

## 2021-01-12 NOTE — Progress Notes (Signed)
   Post-Op Visit Note   Patient: Aaron Berger           Date of Birth: 08/29/1976           MRN: [id number] Visit Date: 01/12/2021 PCP: Patient, No Pcp Per   Assessment & Plan:  Chief Complaint:  Chief Complaint  Patient presents with  . Left Hip - Follow-up  45 year old white male who is about 9 1/2 weeks status post ORIF left hip returns.  States that he continues to have pain in the hip with antalgic gait.  He is ambulating with a cane.  Hip fracture the result of a work-related injury.   Visit Diagnoses:  1. Closed fracture of left femur, unspecified fracture morphology, unspecified portion of femur, initial encounter (HCC)   Status post ORIF left hip November 06, 2020. Status post work-related injury.  Plan: Advised patient that he could use his walker intermittently for ambulation as well.  Still no aggressive activity.  Advised patient that he can anticipate being out of work at least 3 to 4 months postop with injury that he suffered and also due to the type of work that he does.  At some point patient may need formal PT.  Follow with Dr. Ophelia Charter in 4 weeks for recheck for repeat x-rays.  All questions answered.  Follow-Up Instructions: Return in about 4 weeks (around 02/09/2021) for WITH DR YATES RECHECK LEFT HIP.   Orders:  Orders Placed This Encounter  Procedures  . XR HIP UNILAT W OR W/O PELVIS 2-3 VIEWS LEFT   No orders of the defined types were placed in this encounter.   Imaging: No results found.  PMFS History: Patient Active Problem List   Diagnosis Date Noted  . Leukocytosis 11/06/2020  . Nicotine dependence, cigarettes, uncomplicated 11/06/2020  . Closed fracture of left femur (HCC)   . Closed left hip fracture, initial encounter (HCC) 11/05/2020   History reviewed. No pertinent past medical history.  Family History  Problem Relation Age of Onset  . Cirrhosis Mother     Past Surgical History:  Procedure Laterality Date  . INTRAMEDULLARY (IM) NAIL  INTERTROCHANTERIC Left 11/06/2020   Procedure: LEFT INTRAMEDULLARY (IM) NAIL INTERTROCHANTRIC;  Surgeon: Eldred Manges, MD;  Location: [facility];  Service: Orthopedics;  Laterality: Left;  . SPLENECTOMY     Social History   Occupational History  . Not on file  Tobacco Use  . Smoking status: Current Every Day Smoker    Packs/day: 1.00    Types: Cigarettes  . Smokeless tobacco: Never Used  Vaping Use  . Vaping Use: Never used  Substance and Sexual Activity  . Alcohol use: Yes    Comment: daily  . Drug use: Never  . Sexual activity: Not on file    Exam gait is antalgic with a single prong cane.  He does have some hip discomfort with about 10 to 15 degrees internal/external rotation.  Some tenderness around the lateral hip.

## 2021-01-12 NOTE — Telephone Encounter (Signed)
Per Fayrene Fearing, patient can go for gait training if he would like, however, no strengthening at this point.

## 2021-02-08 ENCOUNTER — Ambulatory Visit: Payer: Self-pay | Admitting: Orthopaedic Surgery

## 2021-02-09 ENCOUNTER — Ambulatory Visit: Payer: Self-pay | Admitting: Orthopaedic Surgery

## 2021-02-25 ENCOUNTER — Encounter: Payer: Self-pay | Admitting: Orthopaedic Surgery

## 2021-02-25 ENCOUNTER — Other Ambulatory Visit: Payer: Self-pay

## 2021-02-25 ENCOUNTER — Ambulatory Visit (INDEPENDENT_AMBULATORY_CARE_PROVIDER_SITE_OTHER): Payer: Worker's Compensation | Admitting: Orthopaedic Surgery

## 2021-02-25 ENCOUNTER — Ambulatory Visit (INDEPENDENT_AMBULATORY_CARE_PROVIDER_SITE_OTHER): Payer: Worker's Compensation

## 2021-02-25 VITALS — BP 123/85 | HR 105 | Ht 72.0 in | Wt 170.0 lb

## 2021-02-25 DIAGNOSIS — S7292XA Unspecified fracture of left femur, initial encounter for closed fracture: Secondary | ICD-10-CM | POA: Diagnosis not present
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
## 2021-03-29 ENCOUNTER — Ambulatory Visit: Payer: Self-pay | Admitting: Orthopaedic Surgery
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
## 2022-05-28 IMAGING — RF DG HIP (WITH PELVIS) OPERATIVE*L*
1 series · 4 of 4 positions shown · non-contrast
Comparison: None.

CLINICAL DATA: Left hip fracture repair.

FLUOROSCOPY TIME:  1 minutes.
EXAM:
OPERATIVE LEFT HIP (WITH PELVIS IF PERFORMED) 4 VIEWS
TECHNIQUE: Fluoroscopic spot image(s) were submitted for interpretation
post-operatively.

[Series 1: unknown protocol · 0.14mm/px · 4 of 4 slices shown]
[im 1/4]
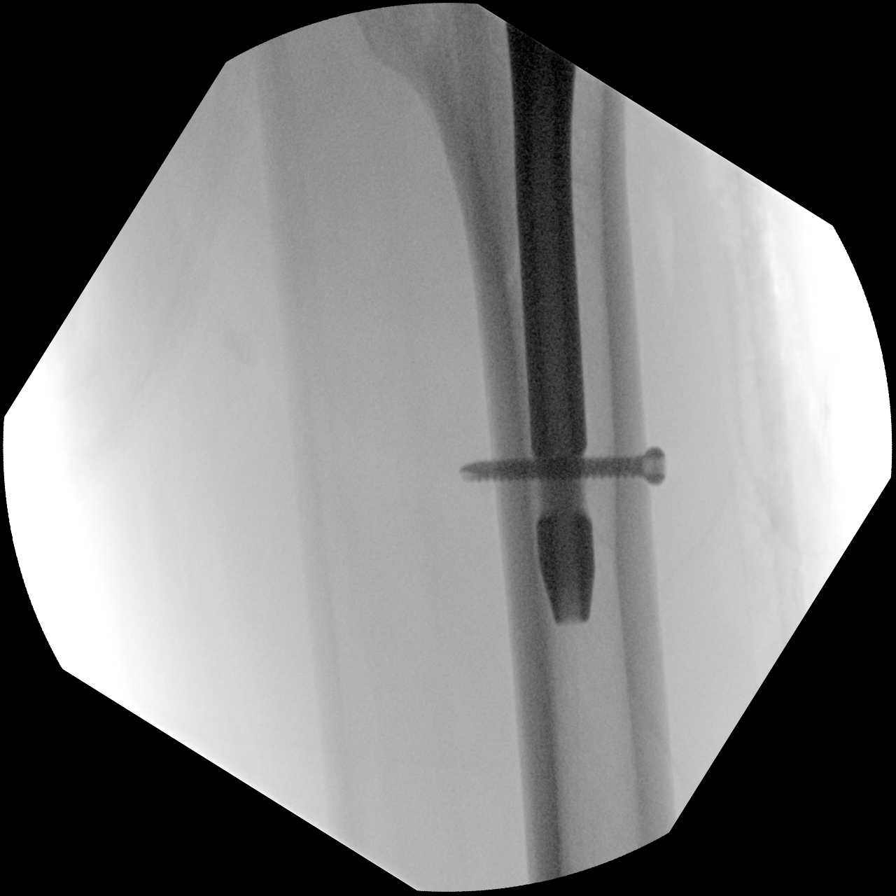
[im 2/4]
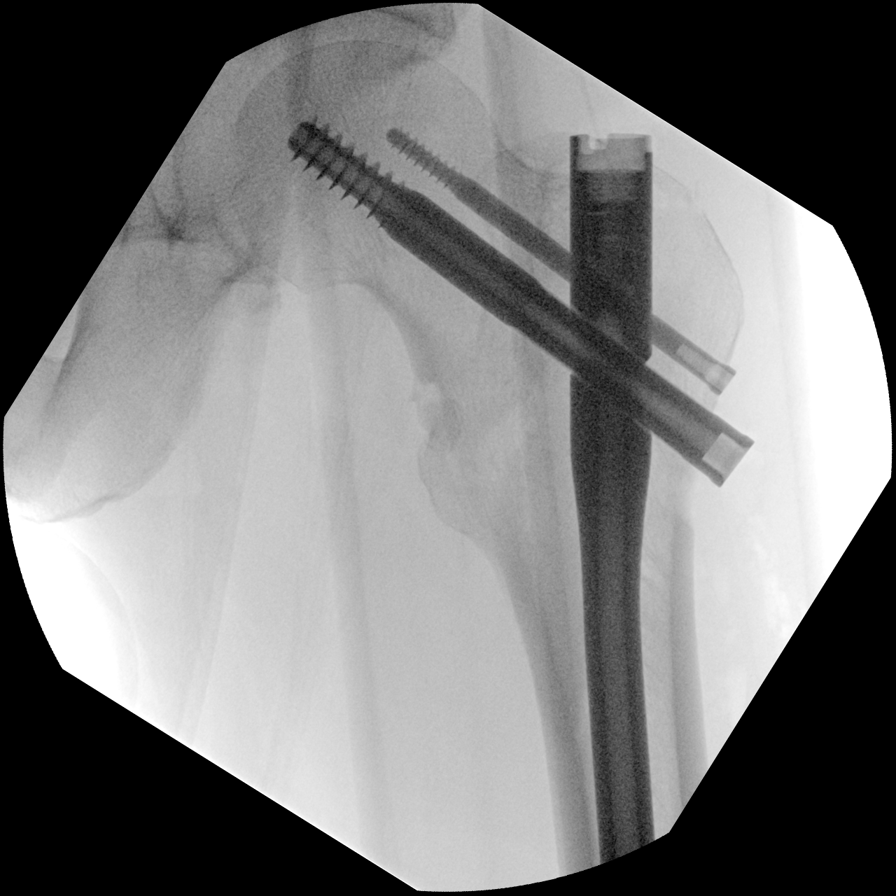
[im 3/4]
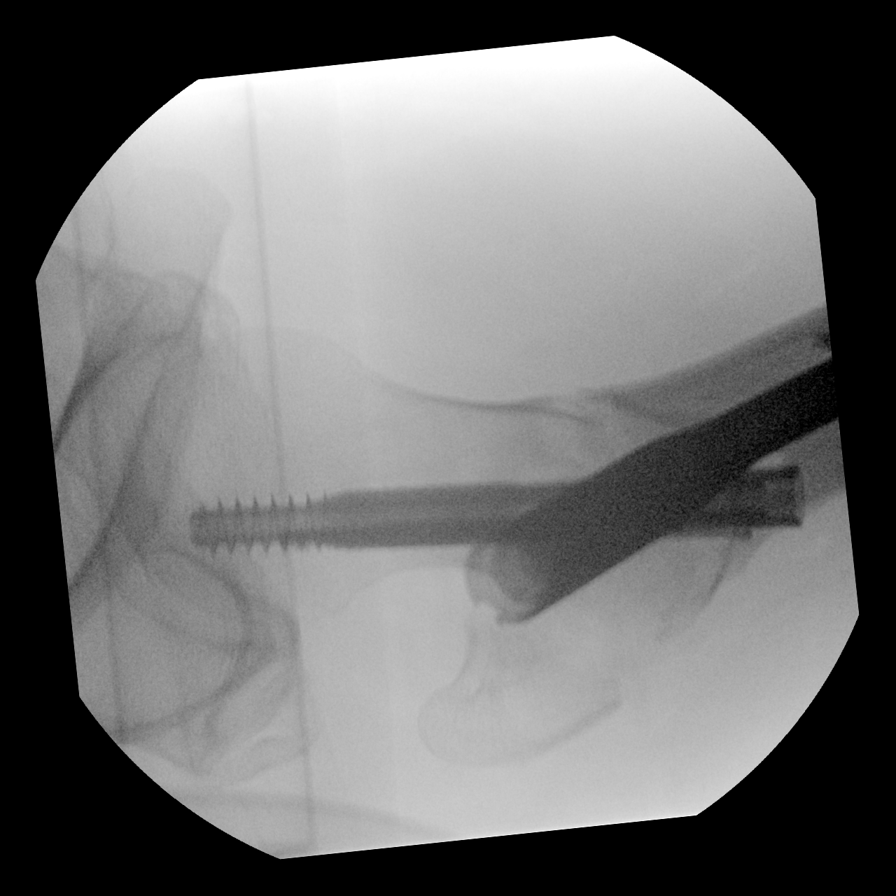
[im 4/4]
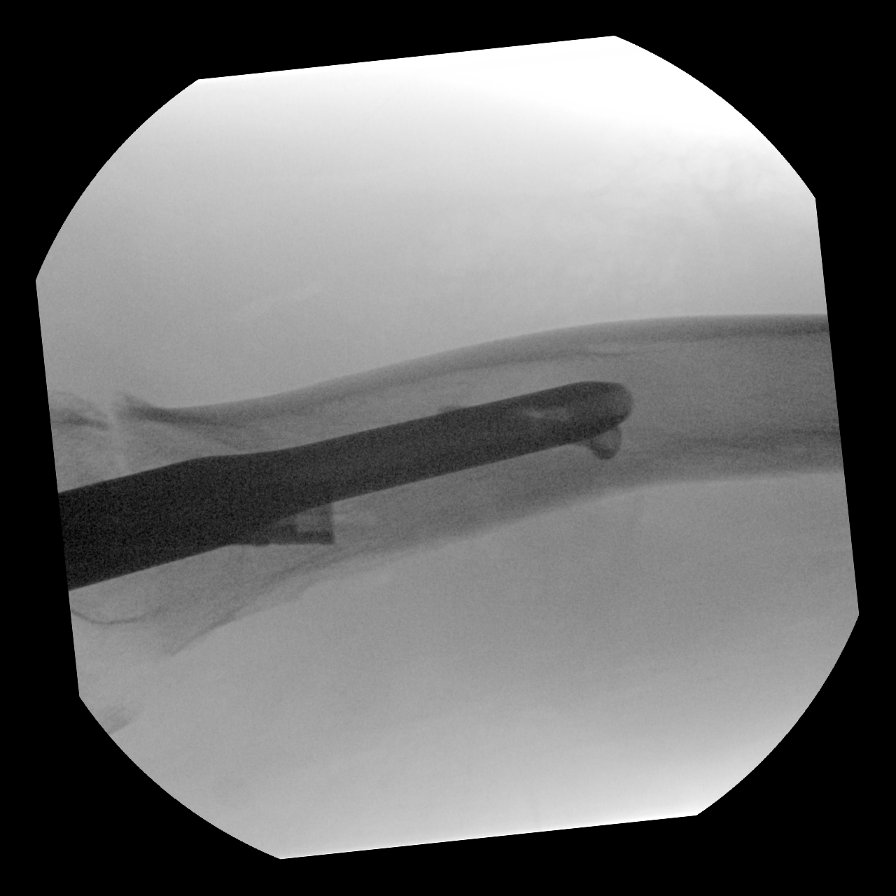

[4 of 4 positions shown; findings below may reference images not displayed]

FINDINGS: An intramedullary rod and 2 gamma nails have been placed across the
comminuted proximal left hip fracture. A distal interlocking screw
is identified. Hardware is in good position.
IMPRESSION: Left hip fracture repair as above.
